# Patient Record
Sex: Female | Born: 1970 | Race: White | Hispanic: No | Marital: Married | Smoking: Light tobacco smoker
Health system: Southern US, Community
[De-identification: ages and names within clinical notes are randomized; demographics above are authoritative.]

## PROBLEM LIST (undated history)

## (undated) DIAGNOSIS — C539 Malignant neoplasm of cervix uteri, unspecified: Secondary | ICD-10-CM

## (undated) DIAGNOSIS — J45909 Unspecified asthma, uncomplicated: Secondary | ICD-10-CM

## (undated) DIAGNOSIS — E079 Disorder of thyroid, unspecified: Secondary | ICD-10-CM

## (undated) DIAGNOSIS — C439 Malignant melanoma of skin, unspecified: Secondary | ICD-10-CM

## (undated) HISTORY — PX: OTHER SURGICAL HISTORY: SHX169

---

## 2008-11-23 ENCOUNTER — Emergency Department (HOSPITAL_COMMUNITY): Admission: EM | Admit: 2008-11-23 | Discharge: 2008-11-23 | Payer: Self-pay | Admitting: Emergency Medicine

## 2010-12-01 LAB — GLUCOSE, CAPILLARY: Glucose-Capillary: 97 mg/dL (ref 70–99)

## 2011-08-30 ENCOUNTER — Emergency Department (HOSPITAL_COMMUNITY)
Admission: EM | Admit: 2011-08-30 | Discharge: 2011-08-30 | Disposition: A | Discharge status: Home / Self Care | Payer: Self-pay | Attending: Emergency Medicine | Admitting: Emergency Medicine

## 2011-08-30 DIAGNOSIS — R10819 Abdominal tenderness, unspecified site: Secondary | ICD-10-CM | POA: Insufficient documentation

## 2011-08-30 DIAGNOSIS — F17200 Nicotine dependence, unspecified, uncomplicated: Secondary | ICD-10-CM | POA: Insufficient documentation

## 2011-08-30 DIAGNOSIS — N3943 Post-void dribbling: Secondary | ICD-10-CM | POA: Insufficient documentation

## 2011-08-30 DIAGNOSIS — R300 Dysuria: Secondary | ICD-10-CM | POA: Insufficient documentation

## 2011-08-30 DIAGNOSIS — R3915 Urgency of urination: Secondary | ICD-10-CM | POA: Insufficient documentation

## 2011-08-30 DIAGNOSIS — N3090 Cystitis, unspecified without hematuria: Secondary | ICD-10-CM | POA: Insufficient documentation

## 2011-08-30 DIAGNOSIS — R350 Frequency of micturition: Secondary | ICD-10-CM | POA: Insufficient documentation

## 2011-08-30 LAB — URINALYSIS, ROUTINE W REFLEX MICROSCOPIC
Bilirubin Urine: NEGATIVE
Ketones, ur: NEGATIVE mg/dL
Leukocytes, UA: NEGATIVE
Nitrite: POSITIVE — AB
Protein, ur: NEGATIVE mg/dL
Urobilinogen, UA: 1 mg/dL (ref 0.0–1.0)
pH: 6.5 (ref 5.0–8.0)

## 2011-08-30 MED ORDER — PHENAZOPYRIDINE HCL 200 MG PO TABS: 200.0000 mg | ORAL_TABLET | Freq: Three times a day (TID) | ORAL | Status: AC

## 2011-08-30 MED ORDER — SULFAMETHOXAZOLE-TRIMETHOPRIM 800-160 MG PO TABS: 1.0000 | ORAL_TABLET | Freq: Two times a day (BID) | ORAL | Status: AC

## 2011-08-30 MED ORDER — SULFAMETHOXAZOLE-TMP DS 800-160 MG PO TABS
1.0000 | ORAL_TABLET | Freq: Once | ORAL | Status: AC
  Administered 2011-08-30: 1 via ORAL
  Filled 2011-08-30: qty 1

## 2011-08-30 NOTE — ED Provider Notes (Signed)
History     CSN: [id number]  Arrival date & time 08/30/11  0219   First MD Initiated Contact with Patient 08/30/11 [phone]      Chief Complaint  Patient presents with  . Urinary Frequency    (Consider location/radiation/quality/duration/timing/severity/associated sxs/prior treatment) Patient is a 41 y.o. female presenting with frequency.  Urinary Frequency   Patient relates she started having suprapubic pressure 6 days ago with frequency, urgency, dribbling, and dysuria. She relates she had some erythromycin antibiotics that she took for 5 days without improvement. She's been taking some pretty and that she had for UTI in September that is not helping control her discomfort. She denies nausea, vomiting, fever, or pain in her flank.  PCP Dr. Margo Common in [place]  History reviewed. No pertinent past medical history.  History reviewed. No pertinent past surgical history.  No family history on file.  History  Substance Use Topics  . Smoking status: Current Everyday Smoker one pack per week   . Smokeless tobacco: Not on file  . Alcohol Use: No   employed  OB History    Grav Para Term Preterm Abortions TAB SAB Ect Mult Living                  Review of Systems  Genitourinary: Positive for frequency.  All other systems reviewed and are negative.    Allergies  Penicillins  Home Medications  none   BP 132/90  Pulse 75  Temp(Src) 98 F (36.7 C) (Oral)  Resp 18  Ht 5\' 6"  (1.676 m)  Wt 130 lb (58.968 kg)  BMI 20.98 kg/m2  SpO2 100%  LMP 08/16/2011  Vital signs normal    Physical Exam  Nursing note and vitals reviewed. Constitutional: She is oriented to person, place, and time. She appears well-developed and well-nourished.  Non-toxic appearance. She does not appear ill. No distress.  HENT:  Head: Normocephalic and atraumatic.  Right Ear: External ear normal.  Left Ear: External ear normal.  Nose: Nose normal. No mucosal edema or rhinorrhea.  Mouth/Throat:  Oropharynx is clear and moist and mucous membranes are normal. No dental abscesses or uvula swelling.  Eyes: Conjunctivae and EOM are normal. Pupils are equal, round, and reactive to light.  Neck: Normal range of motion and full passive range of motion without pain. Neck supple.  Cardiovascular: Normal rate, regular rhythm and normal heart sounds.  Exam reveals no gallop and no friction rub.   No murmur heard. Pulmonary/Chest: Effort normal and breath sounds normal. No respiratory distress. She has no wheezes. She has no rhonchi. She has no rales. She exhibits no tenderness and no crepitus.  Abdominal: Soft. Normal appearance and bowel sounds are normal. She exhibits no distension. There is tenderness. There is no rebound and no guarding.       Patient has mild suprapubic tenderness, she does not have flank pain to percussion  Musculoskeletal: Normal range of motion. She exhibits no edema and no tenderness.       Moves all extremities well.   Neurological: She is alert and oriented to person, place, and time. She has normal strength. No cranial nerve deficit.  Skin: Skin is warm, dry and intact. No rash noted. No erythema. No pallor.  Psychiatric: She has a normal mood and affect. Her speech is normal and behavior is normal. Her mood appears not anxious.    ED Course  Procedures (including critical care time)  Asian started on Septra DS for her UTI.  Results for orders  placed during the hospital encounter of 08/30/11  URINALYSIS, ROUTINE W REFLEX MICROSCOPIC      Component Value Range   Color, Urine AMBER (*) YELLOW    APPearance CLEAR  CLEAR    Specific Gravity, Urine 1.025  1.005 - 1.030    pH 6.5  5.0 - 8.0    Glucose, UA NEGATIVE  NEGATIVE (mg/dL)   Hgb urine dipstick LARGE (*) NEGATIVE    Bilirubin Urine NEGATIVE  NEGATIVE    Ketones, ur NEGATIVE  NEGATIVE (mg/dL)   Protein, ur NEGATIVE  NEGATIVE (mg/dL)   Urobilinogen, UA 1.0  0.0 - 1.0 (mg/dL)   Nitrite POSITIVE (*) NEGATIVE      Leukocytes, UA NEGATIVE  NEGATIVE   PREGNANCY, URINE      Component Value Range   Preg Test, Ur NEGATIVE    URINE MICROSCOPIC-ADD ON      Component Value Range   Squamous Epithelial / LPF RARE  RARE    WBC, UA 0-2  <3 (WBC/hpf)   RBC / HPF 11-20  <3 (RBC/hpf)   Bacteria, UA FEW (*) RARE    Laboratory interpretation all normal except for UTI     1. Cystitis    New Prescriptions   PHENAZOPYRIDINE (PYRIDIUM) 200 MG TABLET    Take 1 tablet (200 mg total) by mouth 3 (three) times daily.   SULFAMETHOXAZOLE-TRIMETHOPRIM (SEPTRA DS) 800-160 MG PER TABLET    Take 1 tablet by mouth 2 (two) times daily.    Plan discharge  MDM          Ward Givens, MD 08/30/11 [phone]

## 2011-08-30 NOTE — ED Notes (Signed)
Thinks she has a uti, has been taking pyridium and azo, w. No relief, has also been taking antibiotic ince alst week not getting any better

## 2016-09-16 ENCOUNTER — Emergency Department (HOSPITAL_COMMUNITY)
Admission: EM | Admit: 2016-09-16 | Discharge: 2016-09-16 | Disposition: A | Discharge status: Home / Self Care | Payer: Self-pay | Attending: Emergency Medicine | Admitting: Emergency Medicine

## 2016-09-16 ENCOUNTER — Emergency Department (HOSPITAL_COMMUNITY): Payer: Self-pay

## 2016-09-16 ENCOUNTER — Encounter (HOSPITAL_COMMUNITY): Payer: Self-pay | Admitting: Emergency Medicine

## 2016-09-16 DIAGNOSIS — J189 Pneumonia, unspecified organism: Secondary | ICD-10-CM

## 2016-09-16 DIAGNOSIS — Z791 Long term (current) use of non-steroidal anti-inflammatories (NSAID): Secondary | ICD-10-CM | POA: Insufficient documentation

## 2016-09-16 DIAGNOSIS — F17200 Nicotine dependence, unspecified, uncomplicated: Secondary | ICD-10-CM | POA: Insufficient documentation

## 2016-09-16 DIAGNOSIS — J181 Lobar pneumonia, unspecified organism: Secondary | ICD-10-CM | POA: Insufficient documentation

## 2016-09-16 MED ORDER — DOXYCYCLINE HYCLATE 100 MG PO TABS
100.0000 mg | ORAL_TABLET | Freq: Once | ORAL | Status: AC
  Administered 2016-09-16: 100 mg via ORAL
  Filled 2016-09-16: qty 1

## 2016-09-16 MED ORDER — BENZONATATE 100 MG PO CAPS: 100.0000 mg | ORAL_CAPSULE | Freq: Three times a day (TID) | ORAL | 0 refills | Status: DC

## 2016-09-16 MED ORDER — DOXYCYCLINE HYCLATE 100 MG PO CAPS: 100.0000 mg | ORAL_CAPSULE | Freq: Two times a day (BID) | ORAL | 0 refills | Status: AC

## 2016-09-16 NOTE — ED Provider Notes (Signed)
[facility]-EMERGENCY DEPT Provider Note   CSN: [id number] Arrival date & time: 09/16/16  2114     History   Chief Complaint Chief Complaint  Patient presents with  . Fever    HPI Hannah PillowWendy J Sahota is a 46 y.o. female.  HPI   Hannah Ward is a 46 y.o. female, with a history of recurrent sinus infections, presenting to the ED with sinus congestion, drainage, and pressure for the last week. Began to get better and then developed a fever and productive cough with green sputum this morning. Has taken tessalon with some cough relief. She also took some ibuprofen for her fever at around 8 PM tonight. Patient is also concerned she may have developed pneumonia.  Denies N/V/D, difficulty breathing, chest pain, or any other complaints.  No ABX use in the last 3 months.     History reviewed. No pertinent past medical history.  There are no active problems to display for this patient.   History reviewed. No pertinent surgical history.  OB History    No data available       Home Medications    Prior to Admission medications   Medication Sig Start Date End Date Taking? Authorizing Provider  ibuprofen (ADVIL,MOTRIN) 200 MG tablet Take 600 mg by mouth daily as needed for mild pain or moderate pain.   Yes Historical Provider, MD  benzonatate (TESSALON) 100 MG capsule Take 1 capsule (100 mg total) by mouth every 8 (eight) hours. 09/16/16   Shawn C Joy, PA-C  doxycycline (VIBRAMYCIN) 100 MG capsule Take 1 capsule (100 mg total) by mouth 2 (two) times daily. 09/16/16 09/23/16  Anselm PancoastShawn C Joy, PA-C    Family History No family history on file.  Social History Social History  Substance Use Topics  . Smoking status: Light Tobacco Smoker  . Smokeless tobacco: Never Used  . Alcohol use No     Allergies   Penicillins   Review of Systems Review of Systems  Constitutional: Positive for chills and fever.  HENT: Positive for congestion, sinus pain and sinus pressure. Negative for facial  swelling, sore throat and trouble swallowing.   Respiratory: Positive for cough. Negative for shortness of breath.   Cardiovascular: Negative for chest pain.  Gastrointestinal: Negative for abdominal pain, diarrhea, nausea and vomiting.  All other systems reviewed and are negative.    Physical Exam Updated Vital Signs BP 146/80 (BP Location: Left Arm)   Pulse 115   Temp 99.5 F (37.5 C) (Oral)   Resp 18   Ht 5\' 6"  (1.676 m)   Wt 45.4 kg   LMP 09/15/2016   SpO2 97%   BMI 16.14 kg/m   Physical Exam  Constitutional: She appears well-developed and well-nourished. No distress.  HENT:  Head: Normocephalic and atraumatic.  Eyes: Conjunctivae are normal.  Neck: Neck supple.  Cardiovascular: Normal rate, regular rhythm, normal heart sounds and intact distal pulses.   Pulmonary/Chest: Effort normal. No respiratory distress. She has wheezes in the right lower field, the left middle field and the left lower field.  Abdominal: Soft. There is no tenderness. There is no guarding.  Musculoskeletal: She exhibits no edema.  Lymphadenopathy:    She has no cervical adenopathy.  Neurological: She is alert.  Skin: Skin is warm and dry. She is not diaphoretic.  Psychiatric: She has a normal mood and affect. Her behavior is normal.  Nursing note and vitals reviewed.    ED Treatments / Results  Labs (all labs ordered are listed, but  only abnormal results are displayed) Labs Reviewed - No data to display  EKG  EKG Interpretation None       Radiology Dg Chest 2 View  Result Date: 09/16/2016 CLINICAL DATA:  46 year old female with wheezing and productive cough and fever. EXAM: CHEST  2 VIEW COMPARISON:  None. FINDINGS: Two views of the chest demonstrate focal area of increased density in the left lower lobe posteriorly concerning for developing infiltrate. The right lung is clear. There is no pleural effusion or pneumothorax. The cardiac silhouette is within normal limits. No acute  osseous pathology. IMPRESSION: Left lower lobe pneumonia. Clinical correlation and follow-up resolution recommended. Electronically Signed   By: Elgie Collard M.D.   On: 09/16/2016 23:02    Procedures Procedures (including critical care time)  Medications Ordered in ED Medications  doxycycline (VIBRA-TABS) tablet 100 mg (100 mg Oral Given 09/16/16 2326)     Initial Impression / Assessment and Plan / ED Course  I have reviewed the triage vital signs and the nursing notes.  Pertinent labs & imaging results that were available during my care of the patient were reviewed by me and considered in my medical decision making (see chart for details).     Patient presents with cough and fever. Evidence of pneumonia on chest x-ray. Antibiotic therapy initiated. PCP follow-up. Further home care and return precautions discussed. Patient voices understanding of all instructions and is comfortable with discharge.   Vitals:   09/16/16 2126 09/16/16 2137 09/16/16 2327  BP: 146/80  119/74  Pulse: 115  101  Resp: 18  16  Temp: 99.5 F (37.5 C)  98.5 F (36.9 C)  TempSrc: Oral  Oral  SpO2: 97%  98%  Weight:  45.4 kg   Height:  5\' 6"  (1.676 m)      Final Clinical Impressions(s) / ED Diagnoses   Final diagnoses:  Community acquired pneumonia of left lower lobe of lung (HCC)    New Prescriptions Discharge Medication List as of 09/16/2016 11:16 PM    START taking these medications   Details  benzonatate (TESSALON) 100 MG capsule Take 1 capsule (100 mg total) by mouth every 8 (eight) hours., Starting Fri 09/16/2016, Print    doxycycline (VIBRAMYCIN) 100 MG capsule Take 1 capsule (100 mg total) by mouth 2 (two) times daily., Starting Fri 09/16/2016, Until Fri 09/23/2016, Print         Anselm Pancoast, PA-C 09/16/16 2340    Bethann Berkshire, MD 09/17/16 2300

## 2016-09-16 NOTE — Discharge Instructions (Signed)
There was evidence of pneumonia in the left lung. Please take all of your antibiotics until finished!   You may develop abdominal discomfort or diarrhea from the antibiotic.  You may help offset this with probiotics which you can buy or get in yogurt. Do not eat or take the probiotics until 2 hours after your antibiotic.  Tessalon for cough. Ibuprofen, naproxen, or Tylenol for fever or pain. Follow up with your primary care provider for continued management of this issue. Return to the ED should symptoms worsen.

## 2016-09-16 NOTE — ED Triage Notes (Signed)
Pt sick earlier this week with flu like s/s.  Felt better for past couple of days.  Fever of 100.4 at 8pm tonight, with cough and congestion.  Took 600mg  ibuprofen at 8pm tonight.

## 2019-03-14 ENCOUNTER — Other Ambulatory Visit: Payer: Self-pay

## 2019-03-14 ENCOUNTER — Encounter (HOSPITAL_COMMUNITY): Payer: Self-pay

## 2019-03-14 ENCOUNTER — Emergency Department (HOSPITAL_COMMUNITY): Payer: Self-pay

## 2019-03-14 ENCOUNTER — Emergency Department (HOSPITAL_COMMUNITY)
Admission: EM | Admit: 2019-03-14 | Discharge: 2019-03-14 | Disposition: A | Discharge status: Home / Self Care | Payer: Self-pay | Attending: Emergency Medicine | Admitting: Emergency Medicine

## 2019-03-14 DIAGNOSIS — F17200 Nicotine dependence, unspecified, uncomplicated: Secondary | ICD-10-CM | POA: Insufficient documentation

## 2019-03-14 DIAGNOSIS — M5441 Lumbago with sciatica, right side: Secondary | ICD-10-CM | POA: Insufficient documentation

## 2019-03-14 DIAGNOSIS — M5442 Lumbago with sciatica, left side: Secondary | ICD-10-CM | POA: Insufficient documentation

## 2019-03-14 DIAGNOSIS — R202 Paresthesia of skin: Secondary | ICD-10-CM | POA: Insufficient documentation

## 2019-03-14 LAB — URINALYSIS, ROUTINE W REFLEX MICROSCOPIC
Bacteria, UA: NONE SEEN
Bilirubin Urine: NEGATIVE
Glucose, UA: NEGATIVE mg/dL
Ketones, ur: NEGATIVE mg/dL
Leukocytes,Ua: NEGATIVE
Nitrite: NEGATIVE
Protein, ur: NEGATIVE mg/dL
Specific Gravity, Urine: 1.001 — ABNORMAL LOW (ref 1.005–1.030)
pH: 8 (ref 5.0–8.0)

## 2019-03-14 LAB — POC URINE PREG, ED: Preg Test, Ur: NEGATIVE

## 2019-03-14 MED ORDER — HYDROCODONE-ACETAMINOPHEN 5-325 MG PO TABS: ORAL_TABLET | ORAL | 0 refills | Status: DC

## 2019-03-14 MED ORDER — KETOROLAC TROMETHAMINE 60 MG/2ML IM SOLN
60.0000 mg | Freq: Once | INTRAMUSCULAR | Status: AC
  Administered 2019-03-14: 60 mg via INTRAMUSCULAR
  Filled 2019-03-14: qty 2

## 2019-03-14 MED ORDER — CYCLOBENZAPRINE HCL 10 MG PO TABS: 10.0000 mg | ORAL_TABLET | Freq: Three times a day (TID) | ORAL | 0 refills | Status: DC | PRN

## 2019-03-14 MED ORDER — DIAZEPAM 5 MG PO TABS
5.0000 mg | ORAL_TABLET | Freq: Once | ORAL | Status: AC
  Administered 2019-03-14: 12:00:00 5 mg via ORAL
  Filled 2019-03-14: qty 1

## 2019-03-14 MED ORDER — PREDNISONE 10 MG PO TABS: ORAL_TABLET | ORAL | 0 refills | Status: DC

## 2019-03-14 NOTE — ED Provider Notes (Signed)
[facility] EMERGENCY DEPARTMENT Provider Note   CSN: [id number] Arrival date & time: 03/14/19  1017     History   Chief Complaint Chief Complaint  Patient presents with  . Back Pain    HPI Hannah Ward is a 48 y.o. female.     HPI   Hannah Ward is a 48 y.o. female who presents to the Emergency Department complaining of low back pain for 6 weeks.  She reports noticing a sharp pain to her mid lower back several months ago while bending over.  Since that time, she reports intermittent episodes of pain to her lower back that radiates into both thighs.  She has been seeing a chiropractor and having adjustments to her spine 3 times a week.  Initially she states the manipulation seem to be improving her symptoms, but pain has been worse and more persistent since yesterday.  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  She describes the pain as sharp and worse with movement.  Pain improves with rest.  She describes a tingling pain to the front of her thighs.  She denies numbness of her lower extremities, abdominal pain, urine or bowel changes, fever or chills.  She took ibuprofen and one hydrocodone last evening without relief.   History reviewed. No pertinent past medical history.  There are no active problems to display for this patient.   History reviewed. No pertinent surgical history.   OB History   No obstetric history on file.      Home Medications    Prior to Admission medications   Medication Sig Start Date End Date Taking? Authorizing Provider  benzonatate (TESSALON) 100 MG capsule Take 1 capsule (100 mg total) by mouth every 8 (eight) hours. 09/16/16   Joy, Shawn C, PA-C  ibuprofen (ADVIL,MOTRIN) 200 MG tablet Take 600 mg by mouth daily as needed for mild pain or moderate pain.    [provider]    Family History No family history on file.  Social History Social History   Tobacco Use  . Smoking status: Light  Tobacco Smoker  . Smokeless tobacco: Never Used  Substance Use Topics  . Alcohol use: No  . Drug use: No     Allergies   Penicillins   Review of Systems Review of Systems  Constitutional: Negative for fever.  Respiratory: Negative for shortness of breath.   Cardiovascular: Negative for chest pain.  Gastrointestinal: Negative for abdominal pain, constipation, diarrhea, nausea and vomiting.  Genitourinary: Negative for decreased urine volume, difficulty urinating, dysuria, flank pain and hematuria.  Musculoskeletal: Positive for back pain. Negative for joint swelling and neck pain.  Skin: Negative for rash.  Neurological: Negative for dizziness, weakness and numbness.     Physical Exam Updated Vital Signs BP 124/74   Pulse 66   Temp 98.6 F (37 C) (Oral)   Resp 16   Ht 5\' 6"  (1.676 m)   Wt 59 kg   LMP 02/23/2019 Comment: preg test neg  SpO2 100%   BMI 20.98 kg/m   Physical Exam Vitals signs and nursing note reviewed.  Constitutional:      General: She is not in acute distress.    Appearance: She is well-developed. She is not ill-appearing.     Comments: Patient appears uncomfortable  HENT:     Head: Atraumatic.  Neck:     Musculoskeletal: Normal range of motion and neck supple. No muscular tenderness.  Cardiovascular:  Rate and Rhythm: Normal rate and regular rhythm.     Pulses: Normal pulses.     Comments: DP pulses are strong and palpable bilaterally Pulmonary:     Effort: Pulmonary effort is normal. No respiratory distress.     Breath sounds: Normal breath sounds.  Chest:     Chest wall: No tenderness.  Abdominal:     General: There is no distension.     Palpations: Abdomen is soft.     Tenderness: There is no abdominal tenderness. There is no right CVA tenderness or left CVA tenderness.  Musculoskeletal:        General: Tenderness present.     Lumbar back: She exhibits tenderness and pain. She exhibits normal range of motion, no swelling, no  deformity, no laceration and normal pulse.     Comments: ttp of the lower lumbar spine and bilateral paraspinal muscles.  Pain is reproduced with straight leg raise bilaterally.  Pt has 5/5 strength against resistance of bilateral lower extremities.     Skin:    General: Skin is warm.     Capillary Refill: Capillary refill takes less than 2 seconds.     Findings: No rash.  Neurological:     General: No focal deficit present.     Mental Status: She is alert.     Sensory: Sensation is intact. No sensory deficit.     Motor: Motor function is intact. No weakness or abnormal muscle tone.     Coordination: Coordination is intact.     Gait: Gait normal.     Deep Tendon Reflexes:     Reflex Scores:      Patellar reflexes are 2+ on the right side and 2+ on the left side.      Achilles reflexes are 2+ on the right side and 2+ on the left side.    Comments: CN II-XII grossly intact.        ED Treatments / Results  Labs (all labs ordered are listed, but only abnormal results are displayed) Labs Reviewed  URINALYSIS, ROUTINE W REFLEX MICROSCOPIC - Abnormal; Notable for the following components:      Result Value   Color, Urine COLORLESS (*)    Specific Gravity, Urine 1.001 (*)    Hgb urine dipstick MODERATE (*)    All other components within normal limits  POC URINE PREG, ED    EKG None  Radiology Dg Lumbar Spine Complete  Result Date: 03/14/2019 CLINICAL DATA:  Chronic progressive low back pain. Bilateral leg pain. EXAM: LUMBAR SPINE - COMPLETE 4+ VIEW COMPARISON:  None. FINDINGS: There is no evidence of lumbar spine fracture. Alignment is normal. Intervertebral disc spaces are maintained. IMPRESSION: Negative. Electronically Signed   By: Francene BoyersJames  Maxwell M.D.   On: 03/14/2019 12:34    Procedures Procedures (including critical care time)  Medications Ordered in ED Medications  ketorolac (TORADOL) injection 60 mg (60 mg Intramuscular Given 03/14/19 1148)  diazepam (VALIUM) tablet 5  mg (5 mg Oral Given 03/14/19 1148)     Initial Impression / Assessment and Plan / ED Course  I have reviewed the triage vital signs and the nursing notes.  Pertinent labs & imaging results that were available during my care of the patient were reviewed by me and considered in my medical decision making (see chart for details).        Patient with low back pain for several weeks.  Neurovascularly intact.  She is ambulatory with a slow but steady gait.  No  concerning symptoms for emergent neurological process.  I discussed possibility of a disc related injury and she agrees to close PCP follow-up.  Referral information also provided.  She agrees to treatment plan and close follow-up.  Return precautions discussed.  Final Clinical Impressions(s) / ED Diagnoses   Final diagnoses:  Acute midline low back pain with bilateral sciatica    ED Discharge Orders    None       Pauline Ausriplett, Reka Wist, PA-C 03/14/19 1313    Bethann BerkshireZammit, Joseph, MD 03/14/19 1506

## 2019-03-14 NOTE — ED Triage Notes (Signed)
Pt reports she has had lower back pain for 6 weeks and has been seeing a Restaurant manager, fast food. Reports yesterday she worked and pain got worse. Works as Emergency planning/management officer

## 2019-03-14 NOTE — ED Notes (Signed)
Patient transported to X-ray 

## 2019-03-14 NOTE — ED Notes (Signed)
Pregnancy test negative

## 2019-03-14 NOTE — Discharge Instructions (Addendum)
Alternate ice and heat to your lower back.  Take the medication as directed.  I have provided a list of primary providers.  Please contact someone to establish primary care.  I have also listed spine and neurosurgery group that she may contact in 1 week if not improving.  Return to the ER for any worsening symptoms such as fever, chills, worsening pain or numbness of your lower extremities.   [facility] Primary Care Doctor List    Sinda Du MD. Specialty: Pulmonary Disease Contact information: [place]   [place] [address]  [phone]   Tula Nakayama, MD. Specialty: [facility] Medicine Contact information: [address] [place] [address]  [phone]   Sallee Lange, MD. Specialty: [facility] Medicine Contact information: [place]  [place] [address]  [phone]   Rosita Fire, MD Specialty: Internal Medicine Contact information: [place] [address]  [phone]   Delphina Cahill, MD. Specialty: Internal Medicine Contact information: [place] [address]  [phone]    [facility] (Dr. Maudie Mercury) Specialty: Family Medicine Contact information: [place] [address]  [phone]   Leslie Andrea, MD. Specialty: [facility] Medicine Contact information: [place] [address]  [phone]   Asencion Noble, MD. Specialty: Internal Medicine Contact information: [address]  [place] [address]  [place]  [address] [place], [place] [address] [phone]  Services [facility] offers a variety of basic health services.  Services include but are not limited to: Blood pressure checks  Heart rate checks  Blood sugar checks  Urine analysis  Rapid strep tests  Pregnancy tests.  Health education and  referrals  People needing more complex services will be directed to a physician online. Using these virtual visits, doctors can evaluate and prescribe medicine and treatments. There will be no medication on-site, though [facility] will help patients fill their prescriptions at little to no cost.   For More information please go to: [email or web address]

## 2019-05-22 ENCOUNTER — Other Ambulatory Visit (HOSPITAL_COMMUNITY): Payer: Self-pay | Admitting: Neurosurgery

## 2019-05-22 ENCOUNTER — Other Ambulatory Visit: Payer: Self-pay | Admitting: Neurosurgery

## 2019-05-22 DIAGNOSIS — M545 Low back pain, unspecified: Secondary | ICD-10-CM

## 2019-05-27 ENCOUNTER — Ambulatory Visit (HOSPITAL_COMMUNITY)
Admission: RE | Admit: 2019-05-27 | Discharge: 2019-05-27 | Disposition: A | Discharge status: Home / Self Care | Payer: Self-pay | Source: Ambulatory Visit | Attending: Neurosurgery | Admitting: Neurosurgery

## 2019-05-27 ENCOUNTER — Other Ambulatory Visit: Payer: Self-pay

## 2019-05-27 DIAGNOSIS — M545 Low back pain, unspecified: Secondary | ICD-10-CM

## 2020-03-07 ENCOUNTER — Ambulatory Visit
Admission: EM | Admit: 2020-03-07 | Discharge: 2020-03-07 | Disposition: A | Discharge status: Home / Self Care | Payer: Self-pay | Attending: Emergency Medicine | Admitting: Emergency Medicine

## 2020-03-07 ENCOUNTER — Other Ambulatory Visit: Payer: Self-pay

## 2020-03-07 ENCOUNTER — Encounter: Payer: Self-pay | Admitting: Emergency Medicine

## 2020-03-07 DIAGNOSIS — A6920 Lyme disease, unspecified: Secondary | ICD-10-CM

## 2020-03-07 DIAGNOSIS — W57XXXA Bitten or stung by nonvenomous insect and other nonvenomous arthropods, initial encounter: Secondary | ICD-10-CM

## 2020-03-07 MED ORDER — DOXYCYCLINE HYCLATE 100 MG PO CAPS: 100.0000 mg | ORAL_CAPSULE | Freq: Two times a day (BID) | ORAL | 0 refills | Status: AC

## 2020-03-07 NOTE — Discharge Instructions (Addendum)
Prescribed doxycycline for 14 days Continue to use hydrocortisone cream as needed for itching To prevent tick bites, wear long sleeves, long pants, and light colors. Use insect repellent. Follow the instructions on the bottle. If the tick is biting, do not try to remove it with heat, alcohol, petroleum jelly, or fingernail polish. Use tweezers, curved forceps, or a tick-removal tool to grasp the tick. Gently pull up until the tick lets go. Do not twist or jerk the tick. Do not squeeze or crush the tick. Return here or go to ER if you have any new or worsening symptoms (rash, nausea, vomiting, fever, chills, headache, fatigue)

## 2020-03-07 NOTE — ED Triage Notes (Signed)
Pt has area to RT ankle that is reddened and circular.  Pt states she pulled a tick off 02/24/20. Legs have been aching this past week.

## 2020-03-07 NOTE — ED Provider Notes (Signed)
[facility]   [id number] 03/07/20 Arrival Time: 1220   Chief Complaint  Patient presents with  . Insect Bite     SUBJECTIVE: History from: patient.  Hannah Ward is a 49 y.o. female presented to the urgent care with a complaint of tick bite to right ankle for the past 12 days.  Now patient is complaining of rash with redness and central clearing.  Denies a precipitating event, noticed while driving.  Localizes the bite to her right ankle.  Has removed to take.  Denies previous hx of tick bite.  Denies fever, chills, nausea, vomiting, headache, dizziness, weakness, fatigue, rash, or abdominal pain.    ROS: As per HPI.  All other pertinent ROS negative.      History reviewed. No pertinent past medical history. History reviewed. No pertinent surgical history. Allergies  Allergen Reactions  . Penicillins Shortness Of Breath and Swelling    Has patient had a PCN reaction causing immediate rash, facial/tongue/throat swelling, SOB or lightheadedness with hypotension: Yes Has patient had a PCN reaction causing severe rash involving mucus membranes or skin necrosis: No Has patient had a PCN reaction that required hospitalization No Has patient had a PCN reaction occurring within the last 10 years: Non  If all of the above answers are "NO", then may proceed with Cephalosporin use.    No current facility-administered medications on file prior to encounter.   Current Outpatient Medications on File Prior to Encounter  Medication Sig Dispense Refill  . benzonatate (TESSALON) 100 MG capsule Take 1 capsule (100 mg total) by mouth every 8 (eight) hours. 21 capsule 0  . cyclobenzaprine (FLEXERIL) 10 MG tablet Take 1 tablet (10 mg total) by mouth 3 (three) times daily as needed. 21 tablet 0  . HYDROcodone-acetaminophen (NORCO/VICODIN) 5-325 MG tablet Take one tab po q 4 hrs prn pain 10 tablet 0  . ibuprofen (ADVIL,MOTRIN) 200 MG tablet Take 600 mg by mouth daily as needed for mild  pain or moderate pain.    . predniSONE (DELTASONE) 10 MG tablet Take 6 tablets day one, 5 tablets day two, 4 tablets day three, 3 tablets day four, 2 tablets day five, then 1 tablet day six 21 tablet 0   Social History   Socioeconomic History  . Marital status: Married    Spouse name: Not on file  . Number of children: Not on file  . Years of education: Not on file  . Highest education level: Not on file  Occupational History  . Not on file  Tobacco Use  . Smoking status: Light Tobacco Smoker  . Smokeless tobacco: Never Used  Substance and Sexual Activity  . Alcohol use: No  . Drug use: No  . Sexual activity: Yes    Birth control/protection: I.U.D.  Other Topics Concern  . Not on file  Social History Narrative  . Not on file   Social Determinants of Health   Financial Resource Strain:   . Difficulty of Paying Living Expenses:   Food Insecurity:   . Worried About Programme researcher, broadcasting/film/video in the Last Year:   . Barista in the Last Year:   Transportation Needs:   . Freight forwarder (Medical):   Marland Kitchen Lack of Transportation (Non-Medical):   Physical Activity:   . Days of Exercise per Week:   . Minutes of Exercise per Session:   Stress:   . Feeling of Stress :   Social Connections:   . Frequency of Communication with  Friends and Family:   . Frequency of Social Gatherings with Friends and Family:   . Attends Religious Services:   . Active Member of Clubs or Organizations:   . Attends Banker Meetings:   Marland Kitchen Marital Status:   Intimate Partner Violence:   . Fear of Current or Ex-Partner:   . Emotionally Abused:   Marland Kitchen Physically Abused:   . Sexually Abused:    No family history on file.  OBJECTIVE:  Vitals:   03/07/20 1230 03/07/20 1232  BP:  (!) 148/89  Pulse:  92  Resp:  17  Temp:  98.5 F (36.9 C)  TempSrc:  Oral  SpO2:  98%  Weight: 137 lb (62.1 kg)   Height: 5\' 6"  (1.676 m)      Physical Exam Vitals and nursing note reviewed.    Constitutional:      General: She is not in acute distress.    Appearance: Normal appearance. She is normal weight. She is not ill-appearing, toxic-appearing or diaphoretic.  HENT:     Head: Normocephalic.  Cardiovascular:     Rate and Rhythm: Normal rate and regular rhythm.     Pulses: Normal pulses.     Heart sounds: Normal heart sounds. No murmur heard.  No friction rub. No gallop.   Pulmonary:     Effort: Pulmonary effort is normal. No respiratory distress.     Breath sounds: Normal breath sounds. No stridor. No wheezing, rhonchi or rales.  Chest:     Chest wall: No tenderness.  Skin:    General: Skin is warm.     Findings: Rash present.     Comments: Red circular rash with central clearing located on right ankle  Neurological:     Mental Status: She is alert and oriented to person, place, and time.    LABS:  No results found for this or any previous visit (from the past 24 hour(s)).   ASSESSMENT & PLAN:  1. Erythema migrans (Lyme disease)   2. Tick bite, initial encounter     Meds ordered this encounter  Medications  . doxycycline (VIBRAMYCIN) 100 MG capsule    Sig: Take 1 capsule (100 mg total) by mouth 2 (two) times daily for 14 days.    Dispense:  28 capsule    Refill:  0   Patient is stable at discharge.  Her symptom is likely from erythema migrans.  We will treat with doxycycline for 14 days  Discharge Instructions Prescribed doxycycline for 14 days Continue to use hydrocortisone cream as needed for itching To prevent tick bites, wear long sleeves, long pants, and light colors. Use insect repellent. Follow the instructions on the bottle. If the tick is biting, do not try to remove it with heat, alcohol, petroleum jelly, or fingernail polish. Use tweezers, curved forceps, or a tick-removal tool to grasp the tick. Gently pull up until the tick lets go. Do not twist or jerk the tick. Do not squeeze or crush the tick. Return here or go to ER if you have any new  or worsening symptoms (rash, nausea, vomiting, fever, chills, headache, fatigue)  Reviewed expectations re: course of current medical issues. Questions answered. Outlined signs and symptoms indicating need for more acute intervention. Patient verbalized understanding. After Visit Summary given.     Note: This document was prepared using Dragon voice recognition software and may include unintentional dictation errors.     , FNP 03/07/20 1302

## 2020-07-22 IMAGING — DX LUMBAR SPINE - COMPLETE 4+ VIEW
5 series · 5 of 5 positions shown · non-contrast
Comparison: None.

CLINICAL DATA: Chronic progressive low back pain. Bilateral leg
pain.

EXAM:
LUMBAR SPINE - COMPLETE 4+ VIEW

[l-spine ap]
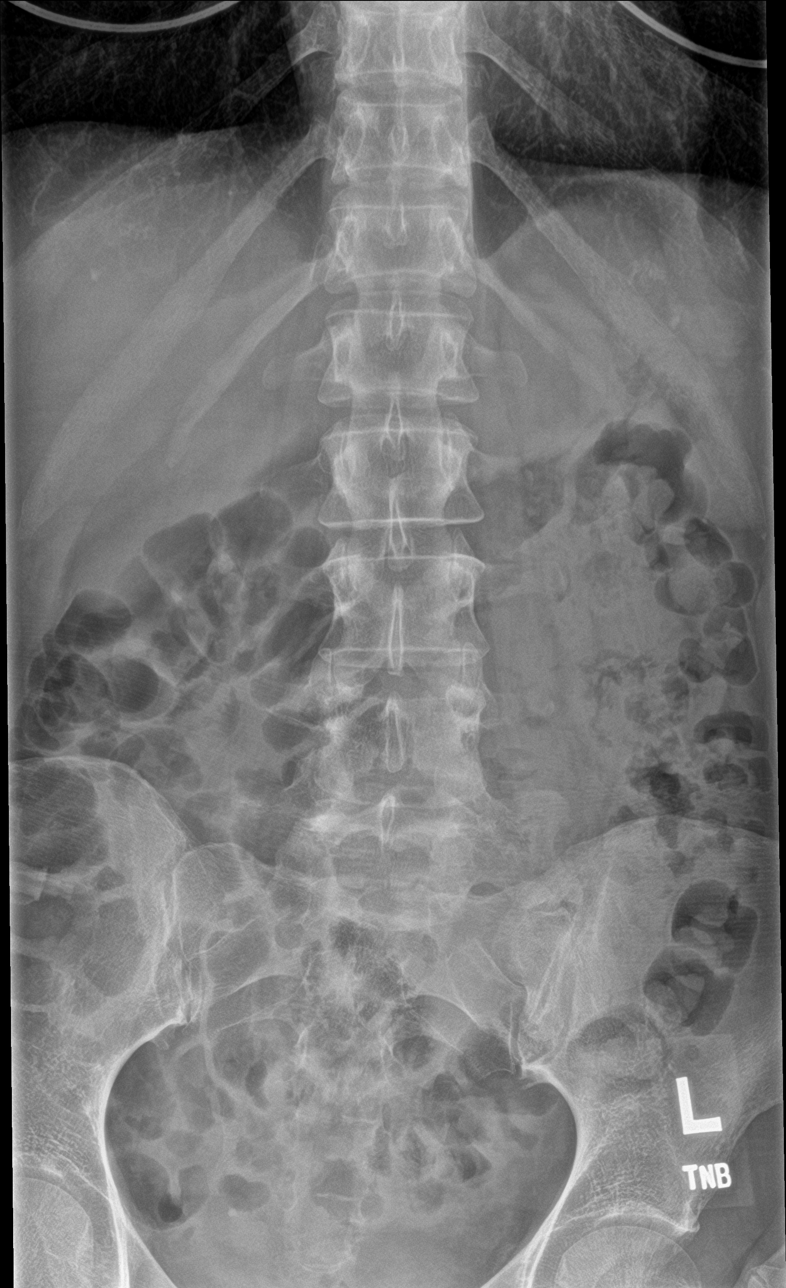

[l-spine obl (1 of 2)]
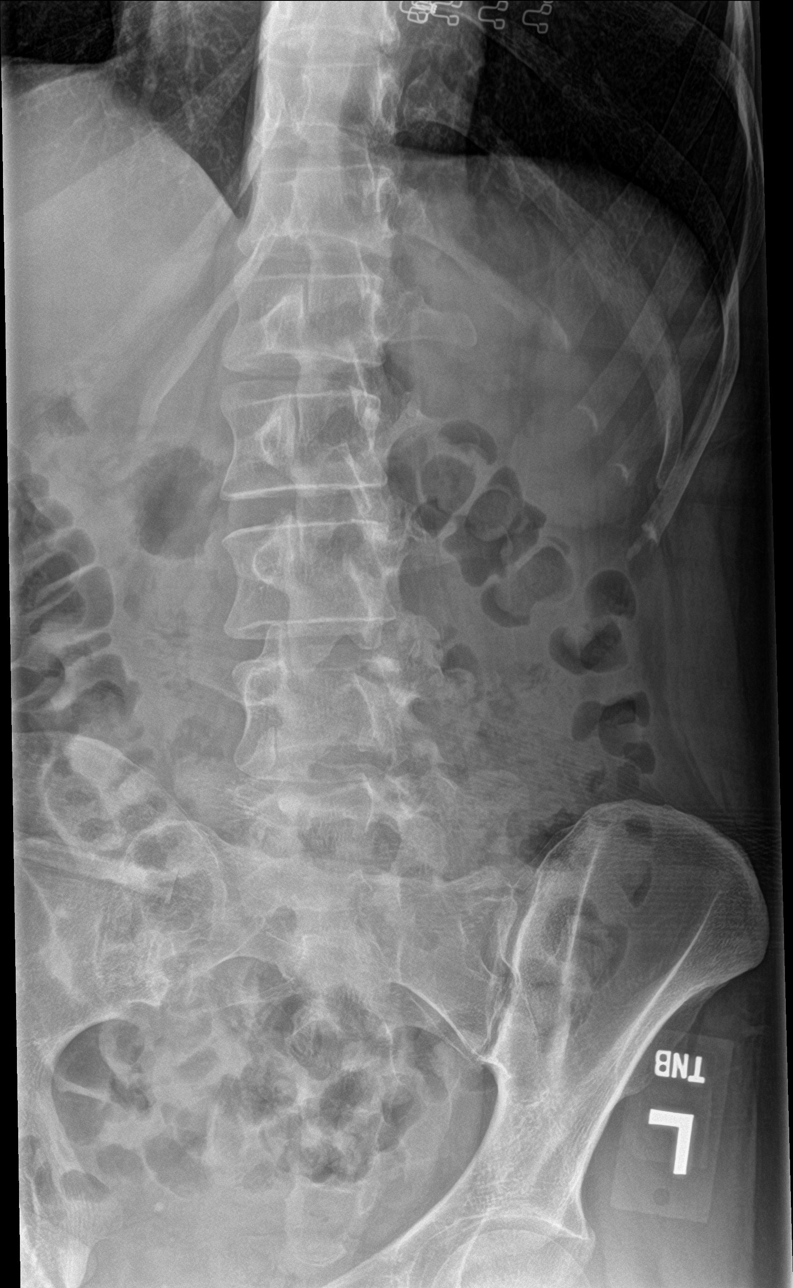

[l-spine obl (2 of 2)]
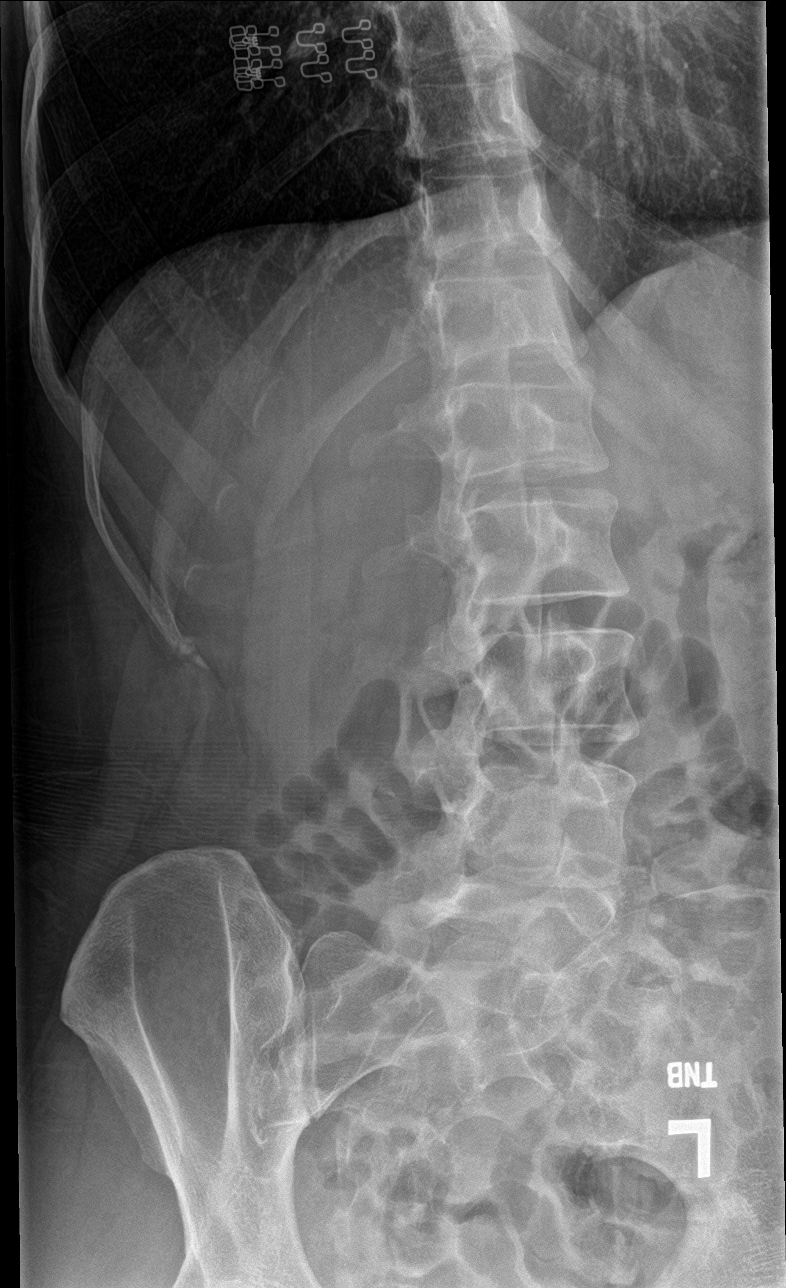

[l-spine lat]
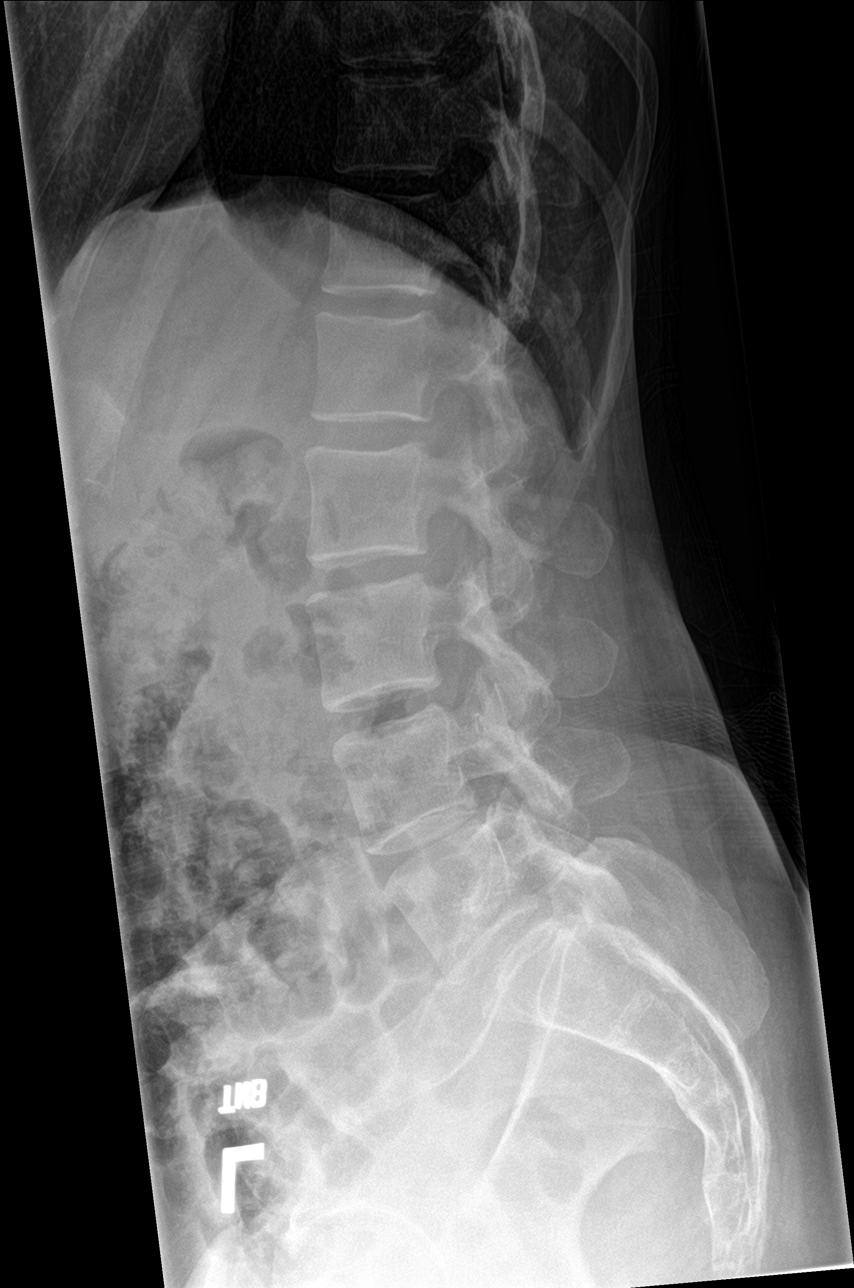

[l-spine spot]
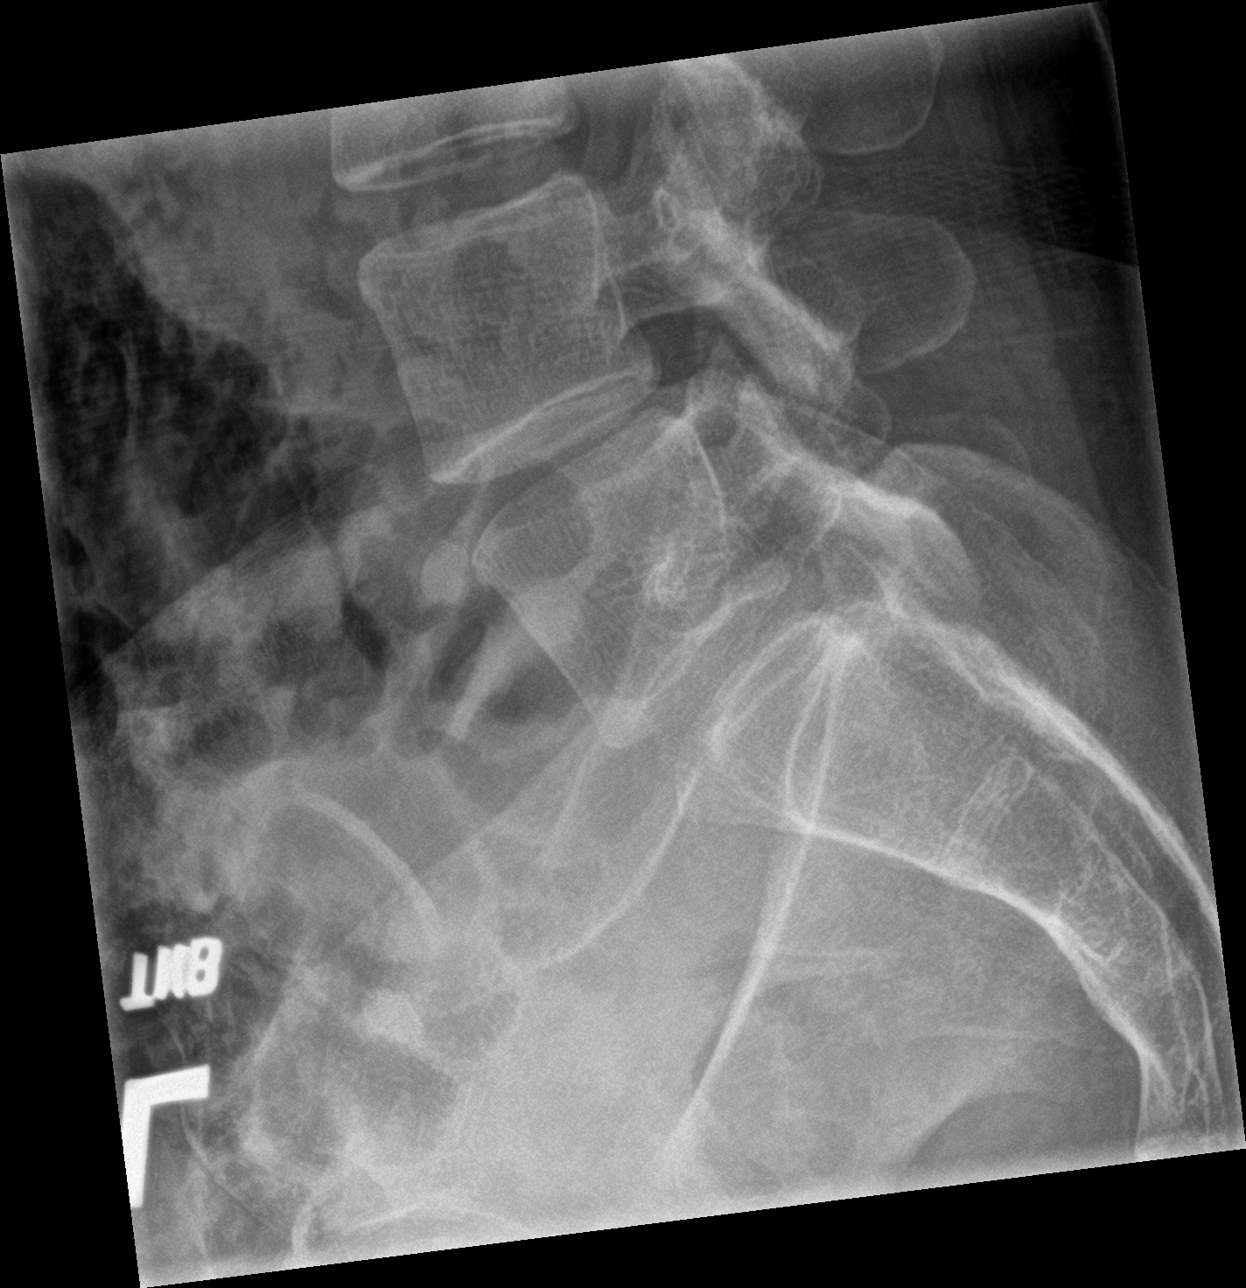

[5 of 5 positions shown; findings below may reference images not displayed]

FINDINGS: There is no evidence of lumbar spine fracture. Alignment is normal.
Intervertebral disc spaces are maintained.
IMPRESSION: Negative.

## 2021-10-23 ENCOUNTER — Ambulatory Visit
Admission: EM | Admit: 2021-10-23 | Discharge: 2021-10-23 | Disposition: A | Discharge status: Home / Self Care | Payer: Self-pay

## 2021-10-23 ENCOUNTER — Other Ambulatory Visit: Payer: Self-pay

## 2021-10-23 DIAGNOSIS — J22 Unspecified acute lower respiratory infection: Secondary | ICD-10-CM

## 2021-10-23 MED ORDER — GUAIFENESIN-CODEINE 100-10 MG/5ML PO SYRP: 5.0000 mL | ORAL_SOLUTION | Freq: Three times a day (TID) | ORAL | 0 refills | Status: AC | PRN

## 2021-10-23 MED ORDER — AZITHROMYCIN 250 MG PO TABS: 250.0000 mg | ORAL_TABLET | Freq: Every day | ORAL | 0 refills | Status: DC

## 2021-10-23 MED ORDER — ALBUTEROL SULFATE HFA 108 (90 BASE) MCG/ACT IN AERS: 1.0000 | INHALATION_SPRAY | Freq: Four times a day (QID) | RESPIRATORY_TRACT | 0 refills | Status: DC | PRN

## 2021-10-23 NOTE — ED Provider Notes (Signed)
?[facility] ? ? ? ?CSN: [id number] ?Arrival date & time: 10/23/21  0854 ? ? ?  ? ?History   ?Chief Complaint ?Chief Complaint  ?Patient presents with  ? Cough  ? Nasal Congestion  ? Sore Throat  ?   ?  ? ? ?HPI ?Hannah Ward is a 51 y.o. female.  ? ?51 year old female presents today with approximately 1 week of worsening cough, congestion, mild shortness of breath.  History of pneumonia and feels similar.  Denies any nasal congestion, rhinorrhea, ear pain, sore throat.  Taking Benadryl ? ? ?Cough ?Sore Throat ? ? ?History reviewed. No pertinent past medical history. ? ?There are no problems to display for this patient. ? ? ?History reviewed. No pertinent surgical history. ? ?OB History   ?No obstetric history on file. ?  ? ? ? ?Home Medications   ? ?Prior to Admission medications   ?Medication Sig Start Date End Date Taking? Authorizing Provider  ?albuterol (VENTOLIN HFA) 108 (90 Base) MCG/ACT inhaler Inhale 1-2 puffs into the lungs every 6 (six) hours as needed for wheezing or shortness of breath. 10/23/21  Yes Nazariah Cadet A, NP  ?azithromycin (ZITHROMAX) 250 MG tablet Take 1 tablet (250 mg total) by mouth daily. Take first 2 tablets together, then 1 every day until finished. 10/23/21  Yes Janace Aris, NP  ?Biotin 1000 MCG tablet Take 1,000 mcg by mouth 3 (three) times daily.   Yes [provider]  ?diphenhydrAMINE (BENADRYL) 50 MG tablet Take 50 mg by mouth at bedtime as needed for itching.   Yes [provider]  ?guaiFENesin-codeine (ROBITUSSIN AC) 100-10 MG/5ML syrup Take 5 mLs by mouth 3 (three) times daily as needed for up to 7 days for cough. 10/23/21 10/30/21 Yes Markesha Hannig A, NP  ?HYDROcodone-acetaminophen (NORCO/VICODIN) 5-325 MG tablet Take one tab po q 4 hrs prn pain 03/14/19   Triplett, Tammy, PA-C  ?ibuprofen (ADVIL,MOTRIN) 200 MG tablet Take 600 mg by mouth daily as needed for mild pain or moderate pain.    [provider]  ?predniSONE (DELTASONE) 10 MG tablet Take 6  tablets day one, 5 tablets day two, 4 tablets day three, 3 tablets day four, 2 tablets day five, then 1 tablet day six 03/14/19   Triplett, Tammy, PA-C  ? ? ?Family History ?History reviewed. No pertinent family history. ? ?Social History ?Social History  ? ?Tobacco Use  ? Smoking status: Light Smoker  ? Smokeless tobacco: Never  ?Substance Use Topics  ? Alcohol use: No  ? Drug use: No  ? ? ? ?Allergies   ?Penicillins ? ? ?Review of Systems ?Review of Systems  ?Respiratory:  Positive for cough.   ? ? ?Physical Exam ?Triage Vital Signs ?ED Triage Vitals  ?Enc Vitals Group  ?   BP 10/23/21 0902 (!) 95/55  ?   Pulse Rate 10/23/21 0902 (!) 102  ?   Resp 10/23/21 0902 18  ?   Temp 10/23/21 0902 98.8 ?F (37.1 ?C)  ?   Temp Source 10/23/21 0902 Oral  ?   SpO2 10/23/21 0902 95 %  ?   Weight --   ?   Height --   ?   Head Circumference --   ?   Peak Flow --   ?   Pain Score 10/23/21 0904 4  ?   Pain Loc --   ?   Pain Edu? --   ?   Excl. in GC? --   ? ?No data found. ? ?  Updated Vital Signs ?BP (!) 95/55 (BP Location: Right Arm)   Pulse (!) 102   Temp 98.8 ?F (37.1 ?C) (Oral)   Resp 18   SpO2 95%  ? ?Visual Acuity ?Right Eye Distance:   ?Left Eye Distance:   ?Bilateral Distance:   ? ?Right Eye Near:   ?Left Eye Near:    ?Bilateral Near:    ? ?Physical Exam ?Constitutional:   ?   Appearance: She is well-developed. She is ill-appearing.  ?HENT:  ?   Head: Normocephalic and atraumatic.  ?   Right Ear: Tympanic membrane and ear canal normal.  ?   Left Ear: Tympanic membrane and ear canal normal.  ?   Mouth/Throat:  ?   Pharynx: Oropharynx is clear.  ?Cardiovascular:  ?   Rate and Rhythm: Tachycardia present.  ?Pulmonary:  ?   Effort: Pulmonary effort is normal.  ?   Breath sounds: Wheezing and rhonchi present.  ?Musculoskeletal:  ?   Cervical back: Normal range of motion.  ?Skin: ?   General: Skin is warm and dry.  ?Neurological:  ?   Mental Status: She is alert.  ? ? ? ?UC Treatments / Results  ?Labs ?(all labs ordered are  listed, but only abnormal results are displayed) ?Labs Reviewed - No data to display ? ?EKG ? ? ?Radiology ?No results found. ? ?Procedures ?Procedures (including critical care time) ? ?Medications Ordered in UC ?Medications - No data to display ? ?Initial Impression / Assessment and Plan / UC Course  ?I have reviewed the triage vital signs and the nursing notes. ? ?Pertinent labs & imaging results that were available during my care of the patient were reviewed by me and considered in my medical decision making (see chart for details). ? ?  ? ?Concern for bacterial bronchitis versus pneumonia.  ?Treated with azithromycin. ?Cough medication as needed.  Albuterol as needed ?Final Clinical Impressions(s) / UC Diagnoses  ? ?Final diagnoses:  ?Lower respiratory infection (e.g., bronchitis, pneumonia, pneumonitis, pulmonitis)  ? ? ? ?Discharge Instructions   ? ?  ?Treating you for upper respiratory infection ?Take the medications as prescribed.  Follow-up as needed. ? ? ? ?ED Prescriptions   ? ? Medication Sig Dispense Auth. Provider  ? guaiFENesin-codeine (ROBITUSSIN AC) 100-10 MG/5ML syrup Take 5 mLs by mouth 3 (three) times daily as needed for up to 7 days for cough. 105 mL Elsia Lasota A, NP  ? azithromycin (ZITHROMAX) 250 MG tablet Take 1 tablet (250 mg total) by mouth daily. Take first 2 tablets together, then 1 every day until finished. 6 tablet Katryn Plummer A, NP  ? albuterol (VENTOLIN HFA) 108 (90 Base) MCG/ACT inhaler Inhale 1-2 puffs into the lungs every 6 (six) hours as needed for wheezing or shortness of breath. 1 each Janace Aris, NP  ? ?  ? ?PDMP not reviewed this encounter. ?  ?Janace Aris, NP ?10/23/21 0932 ? ?

## 2021-10-23 NOTE — Discharge Instructions (Signed)
Treating you for upper respiratory infection ?Take the medications as prescribed.  Follow-up as needed. ?

## 2021-10-23 NOTE — ED Triage Notes (Signed)
Pt reports cough, nasal congestion, sore throat when coughing,  chest discomfort when coughing x 4 days. Benadryl gives no relief.  ?

## 2022-08-23 ENCOUNTER — Ambulatory Visit (INDEPENDENT_AMBULATORY_CARE_PROVIDER_SITE_OTHER): Payer: Self-pay

## 2022-08-23 ENCOUNTER — Encounter: Payer: Self-pay | Admitting: Emergency Medicine

## 2022-08-23 ENCOUNTER — Ambulatory Visit
Admission: EM | Admit: 2022-08-23 | Discharge: 2022-08-23 | Disposition: A | Discharge status: Home / Self Care | Payer: Self-pay | Attending: Urgent Care | Admitting: Urgent Care

## 2022-08-23 DIAGNOSIS — R0789 Other chest pain: Secondary | ICD-10-CM

## 2022-08-23 DIAGNOSIS — J4520 Mild intermittent asthma, uncomplicated: Secondary | ICD-10-CM

## 2022-08-23 DIAGNOSIS — U071 COVID-19: Secondary | ICD-10-CM

## 2022-08-23 DIAGNOSIS — R0602 Shortness of breath: Secondary | ICD-10-CM

## 2022-08-23 DIAGNOSIS — R059 Cough, unspecified: Secondary | ICD-10-CM

## 2022-08-23 DIAGNOSIS — R509 Fever, unspecified: Secondary | ICD-10-CM

## 2022-08-23 MED ORDER — PREDNISONE 20 MG PO TABS: 40.0000 mg | ORAL_TABLET | Freq: Every day | ORAL | 0 refills | Status: DC

## 2022-08-23 MED ORDER — PROMETHAZINE-DM 6.25-15 MG/5ML PO SYRP: 2.5000 mL | ORAL_SOLUTION | Freq: Three times a day (TID) | ORAL | 0 refills | Status: DC | PRN

## 2022-08-23 MED ORDER — BENZONATATE 100 MG PO CAPS: 100.0000 mg | ORAL_CAPSULE | Freq: Three times a day (TID) | ORAL | 0 refills | Status: AC | PRN

## 2022-08-23 MED ORDER — ALBUTEROL SULFATE HFA 108 (90 BASE) MCG/ACT IN AERS: 1.0000 | INHALATION_SPRAY | Freq: Four times a day (QID) | RESPIRATORY_TRACT | 0 refills | Status: DC | PRN

## 2022-08-23 NOTE — ED Triage Notes (Signed)
Chest heaviness.  Tested positive for covid on Saturday.  Fever.  Thinks she may have pneumonia

## 2022-08-23 NOTE — ED Provider Notes (Signed)
[facility]  Note:  This document was prepared using Dragon voice recognition software and may include unintentional dictation errors.  MRN: [id number] DOB: 1970/10/11  Subjective:   Hannah Ward is a 52 y.o. female presenting for 6 day history of acute onset persistent chest heaviness, chest congestion, fever, coughing.  Patient test positive for COVID-19 on Saturday.  Has a history of pneumonia and wants to be checked for that with an x-ray.  No current facility-administered medications for this encounter.  Current Outpatient Medications:    albuterol (VENTOLIN HFA) 108 (90 Base) MCG/ACT inhaler, Inhale 1-2 puffs into the lungs every 6 (six) hours as needed for wheezing or shortness of breath., Disp: 1 each, Rfl: 0   Biotin 1000 MCG tablet, Take 1,000 mcg by mouth 3 (three) times daily., Disp: , Rfl:    diphenhydrAMINE (BENADRYL) 50 MG tablet, Take 50 mg by mouth at bedtime as needed for itching., Disp: , Rfl:    ibuprofen (ADVIL,MOTRIN) 200 MG tablet, Take 600 mg by mouth daily as needed for mild pain or moderate pain., Disp: , Rfl:    Allergies  Allergen Reactions   Penicillins Shortness Of Breath and Swelling    Has patient had a PCN reaction causing immediate rash, facial/tongue/throat swelling, SOB or lightheadedness with hypotension: Yes Has patient had a PCN reaction causing severe rash involving mucus membranes or skin necrosis: No Has patient had a PCN reaction that required hospitalization No Has patient had a PCN reaction occurring within the last 10 years: Non  If all of the above answers are "NO", then may proceed with Cephalosporin use.     History reviewed. No pertinent past medical history.   History reviewed. No pertinent surgical history.  History reviewed. No pertinent family history.  Social History   Tobacco Use   Smoking status: Light Smoker   Smokeless tobacco: Never  Substance Use Topics   Alcohol use: No   Drug use: No     ROS   Objective:   Vitals: BP 132/84 (BP Location: Right Arm)   Pulse 80   Temp 98.1 F (36.7 C) (Oral)   Resp 18   LMP 05/22/2022 (Approximate)   SpO2 96%   Physical Exam Constitutional:      General: She is not in acute distress.    Appearance: Normal appearance. She is well-developed. She is not ill-appearing, toxic-appearing or diaphoretic.  HENT:     Head: Normocephalic and atraumatic.     Nose: Nose normal.     Mouth/Throat:     Mouth: Mucous membranes are moist.  Eyes:     General: No scleral icterus.       Right eye: No discharge.        Left eye: No discharge.     Extraocular Movements: Extraocular movements intact.  Cardiovascular:     Rate and Rhythm: Normal rate and regular rhythm.     Heart sounds: Normal heart sounds. No murmur heard.    No friction rub. No gallop.  Pulmonary:     Effort: Pulmonary effort is normal. No respiratory distress.     Breath sounds: No stridor. No wheezing, rhonchi or rales.  Chest:     Chest wall: No tenderness.  Skin:    General: Skin is warm and dry.  Neurological:     General: No focal deficit present.     Mental Status: She is alert and oriented to person, place, and time.  Psychiatric:        Mood  and Affect: Mood normal.        Behavior: Behavior normal.     DG Chest 2 View  Result Date: 08/23/2022 CLINICAL DATA:  Shortness of breath, cough and fever EXAM: CHEST - 2 VIEW COMPARISON:  09/16/2016 FINDINGS: The heart size and mediastinal contours are within normal limits. Both lungs are clear. The visualized skeletal structures are unremarkable. IMPRESSION: No acute abnormality of the lungs. Electronically Signed   By: Delanna Ahmadi M.D.   On: 08/23/2022 13:17     Assessment and Plan :   PDMP not reviewed this encounter.  1. COVID-19   2. Chest heaviness     Has a history of reactive airways and has previously done well with albuterol and steroids.  Recommended a course of each.  Otherwise continue with  supportive care for COVID-19 infection.  Chest x-ray negative. Counseled patient on potential for adverse effects with medications prescribed/recommended today, ER and return-to-clinic precautions discussed, patient verbalized understanding.    Jaynee Eagles, [place] 08/23/22 1327

## 2023-02-22 DIAGNOSIS — C4359 Malignant melanoma of other part of trunk: Secondary | ICD-10-CM | POA: Diagnosis not present

## 2023-03-14 DIAGNOSIS — J9811 Atelectasis: Secondary | ICD-10-CM | POA: Diagnosis not present

## 2023-03-14 DIAGNOSIS — C4359 Malignant melanoma of other part of trunk: Secondary | ICD-10-CM | POA: Diagnosis not present

## 2023-03-14 DIAGNOSIS — K5730 Diverticulosis of large intestine without perforation or abscess without bleeding: Secondary | ICD-10-CM | POA: Diagnosis not present

## 2023-03-31 DIAGNOSIS — C4359 Malignant melanoma of other part of trunk: Secondary | ICD-10-CM | POA: Diagnosis not present

## 2023-03-31 DIAGNOSIS — Z880 Allergy status to penicillin: Secondary | ICD-10-CM | POA: Diagnosis not present

## 2023-03-31 DIAGNOSIS — L905 Scar conditions and fibrosis of skin: Secondary | ICD-10-CM | POA: Diagnosis not present

## 2023-03-31 DIAGNOSIS — C774 Secondary and unspecified malignant neoplasm of inguinal and lower limb lymph nodes: Secondary | ICD-10-CM | POA: Diagnosis not present

## 2023-03-31 DIAGNOSIS — Z006 Encounter for examination for normal comparison and control in clinical research program: Secondary | ICD-10-CM | POA: Diagnosis not present

## 2023-03-31 DIAGNOSIS — Z87891 Personal history of nicotine dependence: Secondary | ICD-10-CM | POA: Diagnosis not present

## 2023-04-10 DIAGNOSIS — C4359 Malignant melanoma of other part of trunk: Secondary | ICD-10-CM | POA: Diagnosis not present

## 2023-04-10 DIAGNOSIS — Z09 Encounter for follow-up examination after completed treatment for conditions other than malignant neoplasm: Secondary | ICD-10-CM | POA: Diagnosis not present

## 2023-04-14 DIAGNOSIS — F4323 Adjustment disorder with mixed anxiety and depressed mood: Secondary | ICD-10-CM | POA: Diagnosis not present

## 2023-04-14 DIAGNOSIS — G4700 Insomnia, unspecified: Secondary | ICD-10-CM | POA: Diagnosis not present

## 2023-04-14 DIAGNOSIS — C4359 Malignant melanoma of other part of trunk: Secondary | ICD-10-CM | POA: Diagnosis not present

## 2023-04-17 DIAGNOSIS — C4359 Malignant melanoma of other part of trunk: Secondary | ICD-10-CM | POA: Diagnosis not present

## 2023-04-17 DIAGNOSIS — Z09 Encounter for follow-up examination after completed treatment for conditions other than malignant neoplasm: Secondary | ICD-10-CM | POA: Diagnosis not present

## 2023-04-26 DIAGNOSIS — C4359 Malignant melanoma of other part of trunk: Secondary | ICD-10-CM | POA: Diagnosis not present

## 2023-05-01 DIAGNOSIS — Z5181 Encounter for therapeutic drug level monitoring: Secondary | ICD-10-CM | POA: Diagnosis not present

## 2023-05-01 DIAGNOSIS — C4359 Malignant melanoma of other part of trunk: Secondary | ICD-10-CM | POA: Diagnosis not present

## 2023-05-01 DIAGNOSIS — C439 Malignant melanoma of skin, unspecified: Secondary | ICD-10-CM | POA: Diagnosis not present

## 2023-05-01 DIAGNOSIS — F4323 Adjustment disorder with mixed anxiety and depressed mood: Secondary | ICD-10-CM | POA: Diagnosis not present

## 2023-05-01 DIAGNOSIS — Z79899 Other long term (current) drug therapy: Secondary | ICD-10-CM | POA: Diagnosis not present

## 2023-05-15 DIAGNOSIS — F4323 Adjustment disorder with mixed anxiety and depressed mood: Secondary | ICD-10-CM | POA: Diagnosis not present

## 2023-05-15 DIAGNOSIS — G4700 Insomnia, unspecified: Secondary | ICD-10-CM | POA: Diagnosis not present

## 2023-05-17 DIAGNOSIS — Z79899 Other long term (current) drug therapy: Secondary | ICD-10-CM | POA: Diagnosis not present

## 2023-05-17 DIAGNOSIS — R7989 Other specified abnormal findings of blood chemistry: Secondary | ICD-10-CM | POA: Diagnosis not present

## 2023-05-17 DIAGNOSIS — C779 Secondary and unspecified malignant neoplasm of lymph node, unspecified: Secondary | ICD-10-CM | POA: Diagnosis not present

## 2023-05-17 DIAGNOSIS — C439 Malignant melanoma of skin, unspecified: Secondary | ICD-10-CM | POA: Diagnosis not present

## 2023-05-22 DIAGNOSIS — Z803 Family history of malignant neoplasm of breast: Secondary | ICD-10-CM | POA: Diagnosis not present

## 2023-05-22 DIAGNOSIS — C439 Malignant melanoma of skin, unspecified: Secondary | ICD-10-CM | POA: Diagnosis not present

## 2023-05-22 DIAGNOSIS — Z79899 Other long term (current) drug therapy: Secondary | ICD-10-CM | POA: Diagnosis not present

## 2023-05-22 DIAGNOSIS — Z5112 Encounter for antineoplastic immunotherapy: Secondary | ICD-10-CM | POA: Diagnosis not present

## 2023-05-22 DIAGNOSIS — C4359 Malignant melanoma of other part of trunk: Secondary | ICD-10-CM | POA: Diagnosis not present

## 2023-05-22 DIAGNOSIS — Z808 Family history of malignant neoplasm of other organs or systems: Secondary | ICD-10-CM | POA: Diagnosis not present

## 2023-05-22 DIAGNOSIS — R7401 Elevation of levels of liver transaminase levels: Secondary | ICD-10-CM | POA: Diagnosis not present

## 2023-05-23 DIAGNOSIS — Z419 Encounter for procedure for purposes other than remedying health state, unspecified: Secondary | ICD-10-CM | POA: Diagnosis not present

## 2023-06-19 DIAGNOSIS — R7401 Elevation of levels of liver transaminase levels: Secondary | ICD-10-CM | POA: Diagnosis not present

## 2023-06-19 DIAGNOSIS — R002 Palpitations: Secondary | ICD-10-CM | POA: Diagnosis not present

## 2023-06-19 DIAGNOSIS — M79605 Pain in left leg: Secondary | ICD-10-CM | POA: Diagnosis not present

## 2023-06-19 DIAGNOSIS — C4359 Malignant melanoma of other part of trunk: Secondary | ICD-10-CM | POA: Diagnosis not present

## 2023-06-19 DIAGNOSIS — M79604 Pain in right leg: Secondary | ICD-10-CM | POA: Diagnosis not present

## 2023-06-19 DIAGNOSIS — Z79899 Other long term (current) drug therapy: Secondary | ICD-10-CM | POA: Diagnosis not present

## 2023-06-19 DIAGNOSIS — Z5112 Encounter for antineoplastic immunotherapy: Secondary | ICD-10-CM | POA: Diagnosis not present

## 2023-06-19 DIAGNOSIS — E0590 Thyrotoxicosis, unspecified without thyrotoxic crisis or storm: Secondary | ICD-10-CM | POA: Diagnosis not present

## 2023-06-23 DIAGNOSIS — Z419 Encounter for procedure for purposes other than remedying health state, unspecified: Secondary | ICD-10-CM | POA: Diagnosis not present

## 2023-07-03 ENCOUNTER — Ambulatory Visit
Admission: EM | Admit: 2023-07-03 | Discharge: 2023-07-03 | Disposition: A | Discharge status: Home / Self Care | Payer: Medicaid Other

## 2023-07-03 DIAGNOSIS — J22 Unspecified acute lower respiratory infection: Secondary | ICD-10-CM

## 2023-07-03 HISTORY — DX: Disorder of thyroid, unspecified: E07.9

## 2023-07-03 HISTORY — DX: Malignant melanoma of skin, unspecified: C43.9

## 2023-07-03 MED ORDER — DOXYCYCLINE HYCLATE 100 MG PO CAPS: 100.0000 mg | ORAL_CAPSULE | Freq: Two times a day (BID) | ORAL | 0 refills | Status: DC

## 2023-07-03 MED ORDER — ALBUTEROL SULFATE (2.5 MG/3ML) 0.083% IN NEBU: 2.5000 mg | INHALATION_SOLUTION | RESPIRATORY_TRACT | 0 refills | Status: DC | PRN

## 2023-07-03 MED ORDER — FLUTICASONE PROPIONATE HFA 110 MCG/ACT IN AERO: 2.0000 | INHALATION_SPRAY | Freq: Two times a day (BID) | RESPIRATORY_TRACT | 0 refills | Status: AC | PRN

## 2023-07-03 MED ORDER — ALBUTEROL SULFATE HFA 108 (90 BASE) MCG/ACT IN AERS: 2.0000 | INHALATION_SPRAY | RESPIRATORY_TRACT | 0 refills | Status: DC | PRN

## 2023-07-03 MED ORDER — PROMETHAZINE-DM 6.25-15 MG/5ML PO SYRP: 5.0000 mL | ORAL_SOLUTION | Freq: Four times a day (QID) | ORAL | 0 refills | Status: DC | PRN

## 2023-07-03 NOTE — ED Provider Notes (Signed)
[facility]    CSN: [id number] Arrival date & time: 07/03/23  1136      History   Chief Complaint Chief Complaint  Patient presents with   Cough    HPI Hannah Ward is a 52 y.o. female.   Patient presenting today with going on 2 weeks of progressively worsening cough, chest congestion, wheezing, shortness of breath.  Denies fever, chest pain, abdominal pain, nausea vomiting or diarrhea.  History of melanoma currently on immunotherapy.  Does smoke cigarettes but no known history of diagnosed chronic pulmonary disease.  Has been using her albuterol inhaler with minimal relief.    Past Medical History:  Diagnosis Date   Melanoma (HCC)    Thyroid disease     There are no problems to display for this patient.   Past Surgical History:  Procedure Laterality Date   mole removal      OB History   No obstetric history on file.      Home Medications    Prior to Admission medications   Medication Sig Start Date End Date Taking? Authorizing Provider  albuterol (PROVENTIL) (2.5 MG/3ML) 0.083% nebulizer solution Take 3 mLs (2.5 mg total) by nebulization every 4 (four) hours as needed for wheezing or shortness of breath. 07/03/23  Yes Particia Nearing, PA-C  albuterol (VENTOLIN HFA) 108 (90 Base) MCG/ACT inhaler Inhale 2 puffs into the lungs every 4 (four) hours as needed. 07/03/23  Yes Particia Nearing, PA-C  atenolol (TENORMIN) 25 MG tablet Take by mouth daily.   Yes [provider]  doxycycline (VIBRAMYCIN) 100 MG capsule Take 1 capsule (100 mg total) by mouth 2 (two) times daily. 07/03/23  Yes Particia Nearing, PA-C  fluticasone (FLOVENT HFA) 110 MCG/ACT inhaler Inhale 2 puffs into the lungs 2 (two) times daily as needed. 07/03/23  Yes Particia Nearing, PA-C  mirtazapine (REMERON SOL-TAB) 15 MG disintegrating tablet Take 15 mg by mouth at bedtime.   Yes [provider]  promethazine-dextromethorphan (PROMETHAZINE-DM)  6.25-15 MG/5ML syrup Take 5 mLs by mouth 4 (four) times daily as needed. 07/03/23  Yes Particia Nearing, PA-C  albuterol (VENTOLIN HFA) 108 (90 Base) MCG/ACT inhaler Inhale 1-2 puffs into the lungs every 6 (six) hours as needed for wheezing or shortness of breath. 08/23/22   Wallis Bamberg, PA-C  benzonatate (TESSALON) 100 MG capsule Take 1 capsule (100 mg total) by mouth 3 (three) times daily as needed for cough. 08/23/22   Wallis Bamberg, PA-C  Biotin 1000 MCG tablet Take 1,000 mcg by mouth 3 (three) times daily.    [provider]  diphenhydrAMINE (BENADRYL) 50 MG tablet Take 50 mg by mouth at bedtime as needed for itching.    [provider]  ibuprofen (ADVIL,MOTRIN) 200 MG tablet Take 600 mg by mouth daily as needed for mild pain or moderate pain.    [provider]  predniSONE (DELTASONE) 20 MG tablet Take 2 tablets (40 mg total) by mouth daily with breakfast. 08/23/22   Wallis Bamberg, PA-C  promethazine-dextromethorphan (PROMETHAZINE-DM) 6.25-15 MG/5ML syrup Take 2.5 mLs by mouth 3 (three) times daily as needed for cough. 08/23/22   Wallis Bamberg, PA-C    Family History History reviewed. No pertinent family history.  Social History Social History   Tobacco Use   Smoking status: Light Smoker   Smokeless tobacco: Never  Substance Use Topics   Alcohol use: No   Drug use: No     Allergies   Penicillins   Review  of Systems Review of Systems Per HPI  Physical Exam Triage Vital Signs ED Triage Vitals  Encounter Vitals Group     BP 07/03/23 1250 135/87     Systolic BP Percentile --      Diastolic BP Percentile --      Pulse Rate 07/03/23 1250 83     Resp 07/03/23 1250 18     Temp 07/03/23 1250 98.2 F (36.8 C)     Temp src --      SpO2 07/03/23 1250 92 %     Weight --      Height --      Head Circumference --      Peak Flow --      Pain Score 07/03/23 1246 0     Pain Loc --      Pain Education --      Exclude from Growth Chart --    No data  found.  Updated Vital Signs BP 135/87   Pulse 83   Temp 98.2 F (36.8 C)   Resp 18   LMP 05/22/2022 (Approximate)   SpO2 92%   Visual Acuity Right Eye Distance:   Left Eye Distance:   Bilateral Distance:    Right Eye Near:   Left Eye Near:    Bilateral Near:     Physical Exam Vitals and nursing note reviewed.  Constitutional:      Appearance: Normal appearance.  HENT:     Head: Atraumatic.     Right Ear: Tympanic membrane and external ear normal.     Left Ear: Tympanic membrane and external ear normal.     Nose: Nose normal.     Mouth/Throat:     Mouth: Mucous membranes are moist.     Pharynx: No posterior oropharyngeal erythema.  Eyes:     Extraocular Movements: Extraocular movements intact.     Conjunctiva/sclera: Conjunctivae normal.  Cardiovascular:     Rate and Rhythm: Normal rate and regular rhythm.     Heart sounds: Normal heart sounds.  Pulmonary:     Effort: Pulmonary effort is normal.     Breath sounds: Wheezing and rales present.  Musculoskeletal:        General: Normal range of motion.     Cervical back: Normal range of motion and neck supple.  Skin:    General: Skin is warm and dry.  Neurological:     Mental Status: She is alert and oriented to person, place, and time.  Psychiatric:        Mood and Affect: Mood normal.        Thought Content: Thought content normal.      UC Treatments / Results  Labs (all labs ordered are listed, but only abnormal results are displayed) Labs Reviewed - No data to display  EKG   Radiology No results found.  Procedures Procedures (including critical care time)  Medications Ordered in UC Medications - No data to display  Initial Impression / Assessment and Plan / UC Course  I have reviewed the triage vital signs and the nursing notes.  Pertinent labs & imaging results that were available during my care of the patient were reviewed by me and considered in my medical decision making (see chart for  details).     Oxygen saturation 92% on room air, she is overall well-appearing in no acute distress but lungs with wheezes, crackles.  Given duration and worsening course, will start doxycycline, Phenergan DM and refill albuterol neb solution and inhaler.  Will also start Flovent to help with inflammation in the lungs.  Discussed supportive over-the-counter medications, home care and return precautions.  Will forego chest x-ray today as we are treating for a suspected bacterial respiratory infection.  Final Clinical Impressions(s) / UC Diagnoses   Final diagnoses:  Lower respiratory infection   Discharge Instructions   None    ED Prescriptions     Medication Sig Dispense Auth. Provider   doxycycline (VIBRAMYCIN) 100 MG capsule Take 1 capsule (100 mg total) by mouth 2 (two) times daily. 20 capsule Particia Nearing, [place]   promethazine-dextromethorphan (PROMETHAZINE-DM) 6.25-15 MG/5ML syrup Take 5 mLs by mouth 4 (four) times daily as needed. 100 mL Particia Nearing, PA-C   albuterol (VENTOLIN HFA) 108 (90 Base) MCG/ACT inhaler Inhale 2 puffs into the lungs every 4 (four) hours as needed. 18 g Particia Nearing, [place]   albuterol (PROVENTIL) (2.5 MG/3ML) 0.083% nebulizer solution Take 3 mLs (2.5 mg total) by nebulization every 4 (four) hours as needed for wheezing or shortness of breath. 75 mL Particia Nearing, PA-C   fluticasone [facility]) 110 MCG/ACT inhaler Inhale 2 puffs into the lungs 2 (two) times daily as needed. 1 each Particia Nearing, PA-C      PDMP not reviewed this encounter.   Particia Nearing, [place] 07/03/23 1323

## 2023-07-03 NOTE — ED Triage Notes (Signed)
Pt presents with cough and chest congestion since 11/2. She felt like she was getting better but has started feeling worse. She did use an albuterol treatment this am.   Pt had nodular melanoma and her oncologist recommend she get a chest xray and flu/covid test.

## 2023-07-17 DIAGNOSIS — E039 Hypothyroidism, unspecified: Secondary | ICD-10-CM | POA: Diagnosis not present

## 2023-07-17 DIAGNOSIS — E0590 Thyrotoxicosis, unspecified without thyrotoxic crisis or storm: Secondary | ICD-10-CM | POA: Diagnosis not present

## 2023-07-17 DIAGNOSIS — Z5112 Encounter for antineoplastic immunotherapy: Secondary | ICD-10-CM | POA: Diagnosis not present

## 2023-07-17 DIAGNOSIS — C439 Malignant melanoma of skin, unspecified: Secondary | ICD-10-CM | POA: Diagnosis not present

## 2023-07-17 DIAGNOSIS — R059 Cough, unspecified: Secondary | ICD-10-CM | POA: Diagnosis not present

## 2023-07-17 DIAGNOSIS — Z79899 Other long term (current) drug therapy: Secondary | ICD-10-CM | POA: Diagnosis not present

## 2023-07-17 DIAGNOSIS — C4359 Malignant melanoma of other part of trunk: Secondary | ICD-10-CM | POA: Diagnosis not present

## 2023-07-18 DIAGNOSIS — C4359 Malignant melanoma of other part of trunk: Secondary | ICD-10-CM | POA: Diagnosis not present

## 2023-07-23 DIAGNOSIS — Z419 Encounter for procedure for purposes other than remedying health state, unspecified: Secondary | ICD-10-CM | POA: Diagnosis not present

## 2023-07-29 ENCOUNTER — Inpatient Hospital Stay
Admission: RE | Admit: 2023-07-29 | Discharge: 2023-07-29 | Discharge status: Home / Self Care | Payer: Medicaid Other | Source: Ambulatory Visit

## 2023-07-29 VITALS — BP 125/90 | HR 99 | Temp 97.5°F | Resp 18

## 2023-07-29 DIAGNOSIS — R062 Wheezing: Secondary | ICD-10-CM | POA: Diagnosis not present

## 2023-07-29 DIAGNOSIS — R350 Frequency of micturition: Secondary | ICD-10-CM | POA: Diagnosis present

## 2023-07-29 DIAGNOSIS — N3000 Acute cystitis without hematuria: Secondary | ICD-10-CM

## 2023-07-29 LAB — POCT URINALYSIS DIP (MANUAL ENTRY)
Bilirubin, UA: NEGATIVE
Glucose, UA: NEGATIVE mg/dL
Ketones, POC UA: NEGATIVE mg/dL
Nitrite, UA: POSITIVE — AB
Protein Ur, POC: 30 mg/dL — AB
Spec Grav, UA: 1.025 (ref 1.010–1.025)
Urobilinogen, UA: 0.2 U/dL
pH, UA: 6.5 (ref 5.0–8.0)

## 2023-07-29 MED ORDER — ALBUTEROL SULFATE HFA 108 (90 BASE) MCG/ACT IN AERS: 2.0000 | INHALATION_SPRAY | Freq: Four times a day (QID) | RESPIRATORY_TRACT | 0 refills | Status: AC | PRN

## 2023-07-29 MED ORDER — PREDNISONE 20 MG PO TABS: 40.0000 mg | ORAL_TABLET | Freq: Every day | ORAL | 0 refills | Status: AC

## 2023-07-29 MED ORDER — ALBUTEROL SULFATE (2.5 MG/3ML) 0.083% IN NEBU: 2.5000 mg | INHALATION_SOLUTION | Freq: Four times a day (QID) | RESPIRATORY_TRACT | 12 refills | Status: AC | PRN

## 2023-07-29 MED ORDER — NITROFURANTOIN MONOHYD MACRO 100 MG PO CAPS: 100.0000 mg | ORAL_CAPSULE | Freq: Two times a day (BID) | ORAL | 0 refills | Status: AC

## 2023-07-29 NOTE — ED Triage Notes (Signed)
Patient c/o urinary frequency that began  last Monday (patient states she is on immunotherapy). The patient reports she has been having some congestion and wheezing as well.   Home interventions: azo

## 2023-07-29 NOTE — Discharge Instructions (Addendum)
Urinalysis shows that you do have a urinary tract infection.  Culture is pending to ensure you have been treated with the appropriate antibiotic.  If the medication needs to be changed, you will be contacted. Take medications as prescribed. Increase fluids. Ibuprofen or Tylenol for pain, fever, or general discomfort. Develop a toileting schedule that will allow you to urinate at least every 2 hours. Avoid caffeine to include tea, soda, and coffee. -If sexually active, void at least 15 to 20 minutes after sexual intercourse.  For your wheezing: Take medication as prescribed. Recommend using humidifier in your bedroom at nighttime during sleep and sleeping elevated on pillows while symptoms persist. Continue to monitor symptoms for worsening.  If symptoms appear to be worsening to include worsening shortness of breath, difficulty breathing, wheezing, or other concerns, you may follow-up in this clinic or in the emergency department for further evaluation.

## 2023-07-29 NOTE — ED Provider Notes (Signed)
[facility]    CSN: [id number] Arrival date & time: 07/29/23  1208      History   Chief Complaint Chief Complaint  Patient presents with   Urinary Frequency    Entered by patient   Nasal Congestion   Wheezing    HPI Hannah Ward is a 52 y.o. female.   The history is provided by the patient.   Patient presents for complaints of urinary frequency, and pain in her pelvic region has been present for the past week.  She denies fever, chills, chest pain, abdominal pain, nausea, vomiting, diarrhea, vaginal symptoms.  Patient denies history of recurrent urinary tract infections, kidney infections, or kidney stones.  Patient reports she currently is on immunotherapy. Has been using Azo for her symptoms.  Patient also complains of wheezing that started this morning.  Patient states when she woke up this morning, she had to "catch her breath."  She states that she was recently treated for pneumonia last month.  She states that she has not had any new fever, body aches, headache, nasal congestion, runny nose, chest pain, abdominal pain, nausea, vomiting, or diarrhea.  Patient reports that she has used her albuterol inhaler quite frequently since she was diagnosed with pneumonia.  States that she did use her inhaler this morning.  Denies prior history of asthma, or seasonal allergies.  She reports that she quit smoking back in June. Past Medical History:  Diagnosis Date   Melanoma (HCC)    Thyroid disease     There are no problems to display for this patient.   Past Surgical History:  Procedure Laterality Date   mole removal      OB History   No obstetric history on file.      Home Medications    Prior to Admission medications   Medication Sig Start Date End Date Taking? Authorizing Provider  levothyroxine (SYNTHROID) 75 MCG tablet Take 1 tablet by mouth daily. 07/17/23 07/16/24 Yes [provider]  albuterol (PROVENTIL) (2.5 MG/3ML) 0.083% nebulizer  solution Take 3 mLs (2.5 mg total) by nebulization every 6 (six) hours as needed for wheezing or shortness of breath. 07/29/23  Yes Leath-Warren, Sadie Haber, NP  albuterol (VENTOLIN HFA) 108 (90 Base) MCG/ACT inhaler Inhale 2 puffs into the lungs every 6 (six) hours as needed. 07/29/23  Yes Leath-Warren, Sadie Haber, NP  atenolol (TENORMIN) 25 MG tablet Take by mouth daily.    [provider]  benzonatate (TESSALON) 100 MG capsule Take 1 capsule (100 mg total) by mouth 3 (three) times daily as needed for cough. 08/23/22   Wallis Bamberg, PA-C  Biotin 1000 MCG tablet Take 1,000 mcg by mouth 3 (three) times daily.    [provider]  diphenhydrAMINE (BENADRYL) 50 MG tablet Take 50 mg by mouth at bedtime as needed for itching.    [provider]  doxycycline (VIBRAMYCIN) 100 MG capsule Take 1 capsule (100 mg total) by mouth 2 (two) times daily. 07/03/23   Particia Nearing, PA-C  fluticasone (FLOVENT HFA) 110 MCG/ACT inhaler Inhale 2 puffs into the lungs 2 (two) times daily as needed. 07/03/23   Particia Nearing, PA-C  ibuprofen (ADVIL,MOTRIN) 200 MG tablet Take 600 mg by mouth daily as needed for mild pain or moderate pain.    [provider]  mirtazapine (REMERON SOL-TAB) 15 MG disintegrating tablet Take 15 mg by mouth at bedtime.    [provider]  nitrofurantoin, macrocrystal-monohydrate, (MACROBID) 100 MG capsule Take 1  capsule (100 mg total) by mouth 2 (two) times daily for 5 days. 07/29/23 08/03/23 Yes Leath-Warren, Sadie Haber, NP  predniSONE (DELTASONE) 20 MG tablet Take 2 tablets (40 mg total) by mouth daily with breakfast for 5 days. 07/29/23 08/03/23 Yes Leath-Warren, Sadie Haber, NP  promethazine-dextromethorphan (PROMETHAZINE-DM) 6.25-15 MG/5ML syrup Take 2.5 mLs by mouth 3 (three) times daily as needed for cough. 08/23/22   Wallis Bamberg, PA-C  promethazine-dextromethorphan (PROMETHAZINE-DM) 6.25-15 MG/5ML syrup Take 5 mLs by mouth 4 (four) times  daily as needed. 07/03/23   Particia Nearing, PA-C    Family History History reviewed. No pertinent family history.  Social History Social History   Tobacco Use   Smoking status: Light Smoker   Smokeless tobacco: Never  Substance Use Topics   Alcohol use: No   Drug use: No     Allergies   Penicillins   Review of Systems Review of Systems Per HPI  Physical Exam Triage Vital Signs ED Triage Vitals  Encounter Vitals Group     BP 07/29/23 1229 (!) 125/90     Systolic BP Percentile --      Diastolic BP Percentile --      Pulse Rate 07/29/23 1229 99     Resp 07/29/23 1229 18     Temp 07/29/23 1229 (!) 97.5 F (36.4 C)     Temp Source 07/29/23 1229 Oral     SpO2 07/29/23 1229 99 %     Weight --      Height --      Head Circumference --      Peak Flow --      Pain Score 07/29/23 1228 5     Pain Loc --      Pain Education --      Exclude from Growth Chart --    No data found.  Updated Vital Signs BP (!) 125/90 (BP Location: Right Arm)   Pulse 99   Temp (!) 97.5 F (36.4 C) (Oral)   Resp 18   LMP 05/22/2022 (Approximate)   SpO2 99%   Visual Acuity Right Eye Distance:   Left Eye Distance:   Bilateral Distance:    Right Eye Near:   Left Eye Near:    Bilateral Near:     Physical Exam Vitals and nursing note reviewed.  Constitutional:      General: She is not in acute distress.    Appearance: Normal appearance.  HENT:     Head: Normocephalic.     Right Ear: Tympanic membrane, ear canal and external ear normal.     Left Ear: Tympanic membrane, ear canal and external ear normal.     Nose: Nose normal.     Mouth/Throat:     Mouth: Mucous membranes are moist.     Pharynx: No posterior oropharyngeal erythema.  Eyes:     Extraocular Movements: Extraocular movements intact.     Conjunctiva/sclera: Conjunctivae normal.     Pupils: Pupils are equal, round, and reactive to light.  Cardiovascular:     Rate and Rhythm: Normal rate and regular rhythm.      Pulses: Normal pulses.     Heart sounds: Normal heart sounds.  Pulmonary:     Effort: Pulmonary effort is normal.     Breath sounds: Wheezing (faint expiratory wheezing noted in the posterior LUL, RUL and RLL) present. No rhonchi or rales.  Abdominal:     General: Bowel sounds are normal.     Palpations: Abdomen is soft.  Tenderness: There is no abdominal tenderness. There is no right CVA tenderness or left CVA tenderness.  Musculoskeletal:     Cervical back: Normal range of motion.  Lymphadenopathy:     Cervical: No cervical adenopathy.  Skin:    General: Skin is warm and dry.  Neurological:     General: No focal deficit present.     Mental Status: She is alert and oriented to person, place, and time.  Psychiatric:        Mood and Affect: Mood normal.        Behavior: Behavior normal.     UC Treatments / Results  Labs (all labs ordered are listed, but only abnormal results are displayed) Labs Reviewed  POCT URINALYSIS DIP (MANUAL ENTRY) - Abnormal; Notable for the following components:      Result Value   Clarity, UA hazy (*)    Blood, UA moderate (*)    Protein Ur, POC =30 (*)    Nitrite, UA Positive (*)    Leukocytes, UA Moderate (2+) (*)    All other components within normal limits    EKG   Radiology No results found.  Procedures Procedures (including critical care time)  Medications Ordered in UC Medications - No data to display  Initial Impression / Assessment and Plan / UC Course  I have reviewed the triage vital signs and the nursing notes.  Pertinent labs & imaging results that were available during my care of the patient were reviewed by me and considered in my medical decision making (see chart for details).  Urinalysis was positive for urinary tract infection.  Urine culture is pending.  With regard to the wheezing, patient did have faint expiratory wheezing noted on exam.  Will treat patient's UTI with Macrobid 100 mg twice daily for the  next 5 days.  For her wheezing, prednisone 40 mg was prescribed along with an albuterol inhaler and albuterol nebulizer solution.  Supportive care recommendations were provided and discussed with the patient to include over-the-counter analgesics, developing a toileting schedule, avoiding caffeine, use of a humidifier, and sleeping elevated.  Patient was given strict follow-up precautions.  Patient was in agreement with this plan of care and verbalized understanding.  All questions were answered.  Patient stable for discharge.  Final Clinical Impressions(s) / UC Diagnoses   Final diagnoses:  Acute cystitis without hematuria     Discharge Instructions      Urinalysis shows that you do have a urinary tract infection.  Culture is pending to ensure you have been treated with the appropriate antibiotic.  If the medication needs to be changed, you will be contacted. Take medications as prescribed. Increase fluids. Ibuprofen or Tylenol for pain, fever, or general discomfort. Develop a toileting schedule that will allow you to urinate at least every 2 hours. Avoid caffeine to include tea, soda, and coffee. -If sexually active, void at least 15 to 20 minutes after sexual intercourse.  For your wheezing: Take medication as prescribed. Recommend using humidifier in your bedroom at nighttime during sleep and sleeping elevated on pillows while symptoms persist. Continue to monitor symptoms for worsening.  If symptoms appear to be worsening to include worsening shortness of breath, difficulty breathing, wheezing, or other concerns, you may follow-up in this clinic or in the emergency department for further evaluation.       ED Prescriptions     Medication Sig Dispense Auth. Provider   nitrofurantoin, macrocrystal-monohydrate, (MACROBID) 100 MG capsule Take 1 capsule (100 mg total) by  mouth 2 (two) times daily for 5 days. 10 capsule Leath-Warren, Sadie Haber, NP   predniSONE (DELTASONE) 20 MG tablet  Take 2 tablets (40 mg total) by mouth daily with breakfast for 5 days. 10 tablet Leath-Warren, Sadie Haber, NP   albuterol (VENTOLIN HFA) 108 (90 Base) MCG/ACT inhaler Inhale 2 puffs into the lungs every 6 (six) hours as needed. 8 g Leath-Warren, Sadie Haber, NP   albuterol (PROVENTIL) (2.5 MG/3ML) 0.083% nebulizer solution Take 3 mLs (2.5 mg total) by nebulization every 6 (six) hours as needed for wheezing or shortness of breath. 75 mL Leath-Warren, Sadie Haber, NP      PDMP not reviewed this encounter.   Abran Cantor, NP 07/29/23 1410

## 2023-07-31 DIAGNOSIS — J40 Bronchitis, not specified as acute or chronic: Secondary | ICD-10-CM | POA: Diagnosis not present

## 2023-07-31 DIAGNOSIS — C4359 Malignant melanoma of other part of trunk: Secondary | ICD-10-CM | POA: Diagnosis not present

## 2023-07-31 DIAGNOSIS — J432 Centrilobular emphysema: Secondary | ICD-10-CM | POA: Diagnosis not present

## 2023-07-31 DIAGNOSIS — J439 Emphysema, unspecified: Secondary | ICD-10-CM | POA: Diagnosis not present

## 2023-07-31 DIAGNOSIS — J209 Acute bronchitis, unspecified: Secondary | ICD-10-CM | POA: Diagnosis not present

## 2023-08-01 LAB — URINE CULTURE: Culture: 70000 — AB

## 2023-08-09 ENCOUNTER — Other Ambulatory Visit: Payer: Self-pay | Admitting: Family Medicine

## 2023-08-10 ENCOUNTER — Ambulatory Visit
Admission: RE | Admit: 2023-08-10 | Discharge: 2023-08-10 | Disposition: A | Discharge status: Home / Self Care | Payer: Medicaid Other | Source: Ambulatory Visit | Attending: Nurse Practitioner | Admitting: Nurse Practitioner

## 2023-08-10 VITALS — BP 157/1 | HR 94 | Temp 98.1°F | Resp 16

## 2023-08-10 DIAGNOSIS — N3001 Acute cystitis with hematuria: Secondary | ICD-10-CM | POA: Insufficient documentation

## 2023-08-10 LAB — POCT URINALYSIS DIP (MANUAL ENTRY)
Bilirubin, UA: NEGATIVE
Glucose, UA: NEGATIVE mg/dL
Ketones, POC UA: NEGATIVE mg/dL
Leukocytes, UA: NEGATIVE
Nitrite, UA: NEGATIVE
Protein Ur, POC: NEGATIVE mg/dL
Spec Grav, UA: 1.02 (ref 1.010–1.025)
Urobilinogen, UA: 0.2 U/dL
pH, UA: 7 (ref 5.0–8.0)

## 2023-08-10 MED ORDER — CEPHALEXIN 500 MG PO CAPS: 500.0000 mg | ORAL_CAPSULE | Freq: Two times a day (BID) | ORAL | 0 refills | Status: AC

## 2023-08-10 NOTE — Discharge Instructions (Signed)
It sounds like you have a UTI.  Take Keflex as prescribed to treat it.  We will contact you if we need to change the antibiotic.  Continue the great water intake.

## 2023-08-10 NOTE — ED Provider Notes (Signed)
[facility]    CSN: [id number] Arrival date & time: 08/10/23  1330      History   Chief Complaint Chief Complaint  Patient presents with   Urinary Frequency    Entered by patient    HPI Hannah Ward is a 52 y.o. female.   Patient presents today with 3-day history of urinary frequency and urgency, pressure in her suprapubic area, and acute on chronic mid low back pain.  She denies burning with urination, new urinary incontinence, hematuria, abdominal pain, flank pain, fever, nausea/vomiting, and vaginal discharge.  Reports that she had a UTI a couple of weeks ago that did fully improve with Macrobid.  Reports she is undergoing immunotherapy right now for melanoma and has been causing many issues for her including thyroid dysfunction, emphysema and bronchitis.  She thinks may also be causing frequent UTIs.  Reports last immunotherapy treatment is next month.  Reports history of UTI in the past, but not typically this frequently.  Patient reports she is allergic to penicillins, has tolerated Keflex well in the past without any reaction.    Past Medical History:  Diagnosis Date   Melanoma (HCC)    Thyroid disease     There are no active problems to display for this patient.   Past Surgical History:  Procedure Laterality Date   mole removal      OB History   No obstetric history on file.      Home Medications    Prior to Admission medications   Medication Sig Start Date End Date Taking? Authorizing Provider  cephALEXin (KEFLEX) 500 MG capsule Take 1 capsule (500 mg total) by mouth 2 (two) times daily for 5 days. 08/10/23 08/15/23 Yes Valentino Nose, NP  albuterol (PROVENTIL) (2.5 MG/3ML) 0.083% nebulizer solution Take 3 mLs (2.5 mg total) by nebulization every 6 (six) hours as needed for wheezing or shortness of breath. 07/29/23   Leath-Warren, Sadie Haber, NP  albuterol (VENTOLIN HFA) 108 (90 Base) MCG/ACT inhaler Inhale 2 puffs into the lungs every 6  (six) hours as needed. 07/29/23   Leath-Warren, Sadie Haber, NP  atenolol (TENORMIN) 25 MG tablet Take by mouth daily.    [provider]  benzonatate (TESSALON) 100 MG capsule Take 1 capsule (100 mg total) by mouth 3 (three) times daily as needed for cough. 08/23/22   Wallis Bamberg, PA-C  Biotin 1000 MCG tablet Take 1,000 mcg by mouth 3 (three) times daily.    [provider]  diphenhydrAMINE (BENADRYL) 50 MG tablet Take 50 mg by mouth at bedtime as needed for itching.    [provider]  doxycycline (VIBRAMYCIN) 100 MG capsule Take 1 capsule (100 mg total) by mouth 2 (two) times daily. 07/03/23   Particia Nearing, PA-C  fluticasone (FLOVENT HFA) 110 MCG/ACT inhaler Inhale 2 puffs into the lungs 2 (two) times daily as needed. 07/03/23   Particia Nearing, PA-C  ibuprofen (ADVIL,MOTRIN) 200 MG tablet Take 600 mg by mouth daily as needed for mild pain or moderate pain.    [provider]  levothyroxine (SYNTHROID) 75 MCG tablet Take 1 tablet by mouth daily. 07/17/23 07/16/24  [provider]  mirtazapine (REMERON SOL-TAB) 15 MG disintegrating tablet Take 15 mg by mouth at bedtime.    [provider]  promethazine-dextromethorphan (PROMETHAZINE-DM) 6.25-15 MG/5ML syrup Take 2.5 mLs by mouth 3 (three) times daily as needed for cough. 08/23/22   Wallis Bamberg, PA-C  promethazine-dextromethorphan (PROMETHAZINE-DM) 6.25-15 MG/5ML syrup Take 5  mLs by mouth 4 (four) times daily as needed. 07/03/23   Particia Nearing, PA-C    Family History History reviewed. No pertinent family history.  Social History Social History   Tobacco Use   Smoking status: Light Smoker   Smokeless tobacco: Never  Substance Use Topics   Alcohol use: No   Drug use: No     Allergies   Penicillins   Review of Systems Review of Systems Per HPI  Physical Exam Triage Vital Signs ED Triage Vitals  Encounter Vitals Group     BP 08/10/23 1351 (!) 157/1      Systolic BP Percentile --      Diastolic BP Percentile --      Pulse Rate 08/10/23 1351 94     Resp 08/10/23 1351 16     Temp 08/10/23 1351 98.1 F (36.7 C)     Temp Source 08/10/23 1351 Oral     SpO2 08/10/23 1351 98 %     Weight --      Height --      Head Circumference --      Peak Flow --      Pain Score 08/10/23 1352 4     Pain Loc --      Pain Education --      Exclude from Growth Chart --    No data found.  Updated Vital Signs BP (!) 157/1 (BP Location: Right Arm)   Pulse 94   Temp 98.1 F (36.7 C) (Oral)   Resp 16   LMP 05/22/2022 (Approximate)   SpO2 98%   Blood pressure: 157/91  Visual Acuity Right Eye Distance:   Left Eye Distance:   Bilateral Distance:    Right Eye Near:   Left Eye Near:    Bilateral Near:     Physical Exam Vitals and nursing note reviewed.  Constitutional:      General: She is not in acute distress.    Appearance: She is not toxic-appearing.  HENT:     Mouth/Throat:     Mouth: Mucous membranes are moist.     Pharynx: Oropharynx is clear.  Abdominal:     General: Abdomen is flat. Bowel sounds are normal. There is no distension.     Palpations: Abdomen is soft. There is no mass.     Tenderness: There is no abdominal tenderness. There is no right CVA tenderness, left CVA tenderness or guarding.  Skin:    General: Skin is warm and dry.     Coloration: Skin is not jaundiced or pale.     Findings: No erythema.  Neurological:     Mental Status: She is alert and oriented to person, place, and time.     Motor: No weakness.     Gait: Gait normal.  Psychiatric:        Behavior: Behavior is cooperative.      UC Treatments / Results  Labs (all labs ordered are listed, but only abnormal results are displayed) Labs Reviewed  POCT URINALYSIS DIP (MANUAL ENTRY) - Abnormal; Notable for the following components:      Result Value   Clarity, UA hazy (*)    Blood, UA small (*)    All other components within normal limits  URINE  CULTURE    EKG   Radiology No results found.  Procedures Procedures (including critical care time)  Medications Ordered in UC Medications - No data to display  Initial Impression / Assessment and Plan / UC Course  I  have reviewed the triage vital signs and the nursing notes.  Pertinent labs & imaging results that were available during my care of the patient were reviewed by me and considered in my medical decision making (see chart for details).   Patient is well-appearing, normotensive, afebrile, not tachycardic, not tachypneic, oxygenating well on room air.    1. Acute cystitis with hematuria Urine very dilute today, shows small mount of blood and is hazy Urine culture pending No red flags in history or on exam today Supportive care discussed Will treat with Keflex 500 mg twice daily for 5 days Return and ER precautions discussed with patient  The patient was given the opportunity to ask questions.  All questions answered to their satisfaction.  The patient is in agreement to this plan.   Final Clinical Impressions(s) / UC Diagnoses   Final diagnoses:  Acute cystitis with hematuria     Discharge Instructions      It sounds like you have a UTI.  Take Keflex as prescribed to treat it.  We will contact you if we need to change the antibiotic.  Continue the great water intake.      ED Prescriptions     Medication Sig Dispense Auth. Provider   cephALEXin (KEFLEX) 500 MG capsule Take 1 capsule (500 mg total) by mouth 2 (two) times daily for 5 days. 10 capsule Valentino Nose, NP      PDMP not reviewed this encounter.   Valentino Nose, NP 08/11/23 1052

## 2023-08-10 NOTE — ED Triage Notes (Signed)
Pt states urinary frequency and lower abdominal pressure for the past week.  Has been taking AZO at home.

## 2023-08-11 LAB — URINE CULTURE: Culture: <10000 — AB

## 2023-08-23 DIAGNOSIS — Z419 Encounter for procedure for purposes other than remedying health state, unspecified: Secondary | ICD-10-CM | POA: Diagnosis not present

## 2023-08-28 DIAGNOSIS — Z5112 Encounter for antineoplastic immunotherapy: Secondary | ICD-10-CM | POA: Diagnosis not present

## 2023-08-28 DIAGNOSIS — C4359 Malignant melanoma of other part of trunk: Secondary | ICD-10-CM | POA: Diagnosis not present

## 2023-08-28 DIAGNOSIS — C439 Malignant melanoma of skin, unspecified: Secondary | ICD-10-CM | POA: Diagnosis not present

## 2023-08-28 DIAGNOSIS — J439 Emphysema, unspecified: Secondary | ICD-10-CM | POA: Diagnosis not present

## 2023-08-28 DIAGNOSIS — Z79899 Other long term (current) drug therapy: Secondary | ICD-10-CM | POA: Diagnosis not present

## 2023-08-28 DIAGNOSIS — Z87891 Personal history of nicotine dependence: Secondary | ICD-10-CM | POA: Diagnosis not present

## 2023-08-31 DIAGNOSIS — J411 Mucopurulent chronic bronchitis: Secondary | ICD-10-CM | POA: Diagnosis not present

## 2023-08-31 DIAGNOSIS — J439 Emphysema, unspecified: Secondary | ICD-10-CM | POA: Diagnosis not present

## 2023-08-31 DIAGNOSIS — Z124 Encounter for screening for malignant neoplasm of cervix: Secondary | ICD-10-CM | POA: Diagnosis not present

## 2023-08-31 DIAGNOSIS — Z114 Encounter for screening for human immunodeficiency virus [HIV]: Secondary | ICD-10-CM | POA: Diagnosis not present

## 2023-08-31 DIAGNOSIS — Z1231 Encounter for screening mammogram for malignant neoplasm of breast: Secondary | ICD-10-CM | POA: Diagnosis not present

## 2023-08-31 DIAGNOSIS — Z0000 Encounter for general adult medical examination without abnormal findings: Secondary | ICD-10-CM | POA: Diagnosis not present

## 2023-08-31 DIAGNOSIS — Z1211 Encounter for screening for malignant neoplasm of colon: Secondary | ICD-10-CM | POA: Diagnosis not present

## 2023-08-31 DIAGNOSIS — R030 Elevated blood-pressure reading, without diagnosis of hypertension: Secondary | ICD-10-CM | POA: Diagnosis not present

## 2023-08-31 DIAGNOSIS — Z13220 Encounter for screening for lipoid disorders: Secondary | ICD-10-CM | POA: Diagnosis not present

## 2023-08-31 DIAGNOSIS — F4323 Adjustment disorder with mixed anxiety and depressed mood: Secondary | ICD-10-CM | POA: Diagnosis not present

## 2023-08-31 DIAGNOSIS — Z23 Encounter for immunization: Secondary | ICD-10-CM | POA: Diagnosis not present

## 2023-09-15 DIAGNOSIS — Z1231 Encounter for screening mammogram for malignant neoplasm of breast: Secondary | ICD-10-CM | POA: Diagnosis not present

## 2023-09-19 DIAGNOSIS — J439 Emphysema, unspecified: Secondary | ICD-10-CM | POA: Diagnosis not present

## 2023-09-22 DIAGNOSIS — D485 Neoplasm of uncertain behavior of skin: Secondary | ICD-10-CM | POA: Diagnosis not present

## 2023-09-23 DIAGNOSIS — Z419 Encounter for procedure for purposes other than remedying health state, unspecified: Secondary | ICD-10-CM | POA: Diagnosis not present

## 2023-09-25 DIAGNOSIS — R059 Cough, unspecified: Secondary | ICD-10-CM | POA: Diagnosis not present

## 2023-09-25 DIAGNOSIS — Z79890 Hormone replacement therapy (postmenopausal): Secondary | ICD-10-CM | POA: Diagnosis not present

## 2023-09-25 DIAGNOSIS — Z79899 Other long term (current) drug therapy: Secondary | ICD-10-CM | POA: Diagnosis not present

## 2023-09-25 DIAGNOSIS — M549 Dorsalgia, unspecified: Secondary | ICD-10-CM | POA: Diagnosis not present

## 2023-09-25 DIAGNOSIS — E039 Hypothyroidism, unspecified: Secondary | ICD-10-CM | POA: Diagnosis not present

## 2023-09-25 DIAGNOSIS — Z808 Family history of malignant neoplasm of other organs or systems: Secondary | ICD-10-CM | POA: Diagnosis not present

## 2023-09-25 DIAGNOSIS — Z5112 Encounter for antineoplastic immunotherapy: Secondary | ICD-10-CM | POA: Diagnosis not present

## 2023-09-25 DIAGNOSIS — R635 Abnormal weight gain: Secondary | ICD-10-CM | POA: Diagnosis not present

## 2023-09-25 DIAGNOSIS — C4359 Malignant melanoma of other part of trunk: Secondary | ICD-10-CM | POA: Diagnosis not present

## 2023-09-25 DIAGNOSIS — Z87891 Personal history of nicotine dependence: Secondary | ICD-10-CM | POA: Diagnosis not present

## 2023-09-25 DIAGNOSIS — G4700 Insomnia, unspecified: Secondary | ICD-10-CM | POA: Diagnosis not present

## 2023-09-25 DIAGNOSIS — J439 Emphysema, unspecified: Secondary | ICD-10-CM | POA: Diagnosis not present

## 2023-09-26 DIAGNOSIS — Z124 Encounter for screening for malignant neoplasm of cervix: Secondary | ICD-10-CM | POA: Diagnosis not present

## 2023-09-26 DIAGNOSIS — R87611 Atypical squamous cells cannot exclude high grade squamous intraepithelial lesion on cytologic smear of cervix (ASC-H): Secondary | ICD-10-CM | POA: Diagnosis not present

## 2023-09-27 DIAGNOSIS — M48061 Spinal stenosis, lumbar region without neurogenic claudication: Secondary | ICD-10-CM | POA: Diagnosis not present

## 2023-09-27 DIAGNOSIS — M5416 Radiculopathy, lumbar region: Secondary | ICD-10-CM | POA: Diagnosis not present

## 2023-10-03 DIAGNOSIS — M5416 Radiculopathy, lumbar region: Secondary | ICD-10-CM | POA: Diagnosis not present

## 2023-10-16 DIAGNOSIS — R87810 Cervical high risk human papillomavirus (HPV) DNA test positive: Secondary | ICD-10-CM | POA: Diagnosis not present

## 2023-10-18 DIAGNOSIS — N870 Mild cervical dysplasia: Secondary | ICD-10-CM | POA: Diagnosis not present

## 2023-10-18 DIAGNOSIS — B977 Papillomavirus as the cause of diseases classified elsewhere: Secondary | ICD-10-CM | POA: Diagnosis not present

## 2023-10-18 DIAGNOSIS — R87611 Atypical squamous cells cannot exclude high grade squamous intraepithelial lesion on cytologic smear of cervix (ASC-H): Secondary | ICD-10-CM | POA: Diagnosis not present

## 2023-10-18 DIAGNOSIS — N72 Inflammatory disease of cervix uteri: Secondary | ICD-10-CM | POA: Diagnosis not present

## 2023-10-21 DIAGNOSIS — Z419 Encounter for procedure for purposes other than remedying health state, unspecified: Secondary | ICD-10-CM | POA: Diagnosis not present

## 2023-10-23 DIAGNOSIS — I10 Essential (primary) hypertension: Secondary | ICD-10-CM | POA: Diagnosis not present

## 2023-10-23 DIAGNOSIS — F4323 Adjustment disorder with mixed anxiety and depressed mood: Secondary | ICD-10-CM | POA: Diagnosis not present

## 2023-10-23 DIAGNOSIS — C439 Malignant melanoma of skin, unspecified: Secondary | ICD-10-CM | POA: Diagnosis not present

## 2023-10-23 DIAGNOSIS — E785 Hyperlipidemia, unspecified: Secondary | ICD-10-CM | POA: Diagnosis not present

## 2023-10-23 DIAGNOSIS — G4700 Insomnia, unspecified: Secondary | ICD-10-CM | POA: Diagnosis not present

## 2023-10-23 DIAGNOSIS — Z803 Family history of malignant neoplasm of breast: Secondary | ICD-10-CM | POA: Diagnosis not present

## 2023-10-23 DIAGNOSIS — J439 Emphysema, unspecified: Secondary | ICD-10-CM | POA: Diagnosis not present

## 2023-10-23 DIAGNOSIS — Z880 Allergy status to penicillin: Secondary | ICD-10-CM | POA: Diagnosis not present

## 2023-10-23 DIAGNOSIS — Z79899 Other long term (current) drug therapy: Secondary | ICD-10-CM | POA: Diagnosis not present

## 2023-10-23 DIAGNOSIS — R0789 Other chest pain: Secondary | ICD-10-CM | POA: Diagnosis not present

## 2023-10-23 DIAGNOSIS — Z5112 Encounter for antineoplastic immunotherapy: Secondary | ICD-10-CM | POA: Diagnosis not present

## 2023-10-23 DIAGNOSIS — Z808 Family history of malignant neoplasm of other organs or systems: Secondary | ICD-10-CM | POA: Diagnosis not present

## 2023-10-23 DIAGNOSIS — C4359 Malignant melanoma of other part of trunk: Secondary | ICD-10-CM | POA: Diagnosis not present

## 2023-10-23 DIAGNOSIS — E039 Hypothyroidism, unspecified: Secondary | ICD-10-CM | POA: Diagnosis not present

## 2023-10-23 DIAGNOSIS — Z87891 Personal history of nicotine dependence: Secondary | ICD-10-CM | POA: Diagnosis not present

## 2023-10-24 DIAGNOSIS — L814 Other melanin hyperpigmentation: Secondary | ICD-10-CM | POA: Diagnosis not present

## 2023-10-24 DIAGNOSIS — Z85820 Personal history of malignant melanoma of skin: Secondary | ICD-10-CM | POA: Diagnosis not present

## 2023-10-24 DIAGNOSIS — D0372 Melanoma in situ of left lower limb, including hip: Secondary | ICD-10-CM | POA: Diagnosis not present

## 2023-10-24 DIAGNOSIS — L570 Actinic keratosis: Secondary | ICD-10-CM | POA: Diagnosis not present

## 2023-10-24 DIAGNOSIS — D229 Melanocytic nevi, unspecified: Secondary | ICD-10-CM | POA: Diagnosis not present

## 2023-10-24 DIAGNOSIS — L821 Other seborrheic keratosis: Secondary | ICD-10-CM | POA: Diagnosis not present

## 2023-10-24 DIAGNOSIS — D492 Neoplasm of unspecified behavior of bone, soft tissue, and skin: Secondary | ICD-10-CM | POA: Diagnosis not present

## 2023-10-26 DIAGNOSIS — C4359 Malignant melanoma of other part of trunk: Secondary | ICD-10-CM | POA: Diagnosis not present

## 2023-11-10 DIAGNOSIS — R0600 Dyspnea, unspecified: Secondary | ICD-10-CM | POA: Diagnosis not present

## 2023-11-10 DIAGNOSIS — R0789 Other chest pain: Secondary | ICD-10-CM | POA: Diagnosis not present

## 2023-11-13 DIAGNOSIS — D0361 Melanoma in situ of right upper limb, including shoulder: Secondary | ICD-10-CM | POA: Diagnosis not present

## 2023-11-20 DIAGNOSIS — C4359 Malignant melanoma of other part of trunk: Secondary | ICD-10-CM | POA: Diagnosis not present

## 2023-11-20 DIAGNOSIS — E039 Hypothyroidism, unspecified: Secondary | ICD-10-CM | POA: Diagnosis not present

## 2023-11-20 DIAGNOSIS — J449 Chronic obstructive pulmonary disease, unspecified: Secondary | ICD-10-CM | POA: Diagnosis not present

## 2023-11-20 DIAGNOSIS — E785 Hyperlipidemia, unspecified: Secondary | ICD-10-CM | POA: Diagnosis not present

## 2023-11-20 DIAGNOSIS — Z79899 Other long term (current) drug therapy: Secondary | ICD-10-CM | POA: Diagnosis not present

## 2023-11-20 DIAGNOSIS — L97119 Non-pressure chronic ulcer of right thigh with unspecified severity: Secondary | ICD-10-CM | POA: Diagnosis not present

## 2023-11-20 DIAGNOSIS — Z808 Family history of malignant neoplasm of other organs or systems: Secondary | ICD-10-CM | POA: Diagnosis not present

## 2023-11-20 DIAGNOSIS — Z5111 Encounter for antineoplastic chemotherapy: Secondary | ICD-10-CM | POA: Diagnosis not present

## 2023-11-20 DIAGNOSIS — Z5112 Encounter for antineoplastic immunotherapy: Secondary | ICD-10-CM | POA: Diagnosis not present

## 2023-11-20 DIAGNOSIS — L97129 Non-pressure chronic ulcer of left thigh with unspecified severity: Secondary | ICD-10-CM | POA: Diagnosis not present

## 2023-11-24 DIAGNOSIS — J432 Centrilobular emphysema: Secondary | ICD-10-CM | POA: Diagnosis not present

## 2023-11-24 DIAGNOSIS — G4700 Insomnia, unspecified: Secondary | ICD-10-CM | POA: Diagnosis not present

## 2023-11-24 DIAGNOSIS — G8929 Other chronic pain: Secondary | ICD-10-CM | POA: Diagnosis not present

## 2023-11-24 DIAGNOSIS — J439 Emphysema, unspecified: Secondary | ICD-10-CM | POA: Diagnosis not present

## 2023-11-24 DIAGNOSIS — E785 Hyperlipidemia, unspecified: Secondary | ICD-10-CM | POA: Diagnosis not present

## 2023-11-24 DIAGNOSIS — I10 Essential (primary) hypertension: Secondary | ICD-10-CM | POA: Diagnosis not present

## 2023-11-24 DIAGNOSIS — E039 Hypothyroidism, unspecified: Secondary | ICD-10-CM | POA: Diagnosis not present

## 2023-11-24 DIAGNOSIS — Z79899 Other long term (current) drug therapy: Secondary | ICD-10-CM | POA: Diagnosis not present

## 2023-11-24 DIAGNOSIS — C439 Malignant melanoma of skin, unspecified: Secondary | ICD-10-CM | POA: Diagnosis not present

## 2023-11-24 DIAGNOSIS — Z23 Encounter for immunization: Secondary | ICD-10-CM | POA: Diagnosis not present

## 2023-11-24 DIAGNOSIS — Z87891 Personal history of nicotine dependence: Secondary | ICD-10-CM | POA: Diagnosis not present

## 2023-11-24 DIAGNOSIS — Z880 Allergy status to penicillin: Secondary | ICD-10-CM | POA: Diagnosis not present

## 2023-11-24 DIAGNOSIS — R590 Localized enlarged lymph nodes: Secondary | ICD-10-CM | POA: Diagnosis not present

## 2023-11-24 DIAGNOSIS — Z79890 Hormone replacement therapy (postmenopausal): Secondary | ICD-10-CM | POA: Diagnosis not present

## 2023-11-24 DIAGNOSIS — F32A Depression, unspecified: Secondary | ICD-10-CM | POA: Diagnosis not present

## 2023-11-24 DIAGNOSIS — Z85820 Personal history of malignant melanoma of skin: Secondary | ICD-10-CM | POA: Diagnosis not present

## 2023-11-24 DIAGNOSIS — R0789 Other chest pain: Secondary | ICD-10-CM | POA: Diagnosis not present

## 2023-11-27 DIAGNOSIS — E785 Hyperlipidemia, unspecified: Secondary | ICD-10-CM | POA: Diagnosis not present

## 2023-11-29 DIAGNOSIS — K219 Gastro-esophageal reflux disease without esophagitis: Secondary | ICD-10-CM | POA: Diagnosis not present

## 2023-11-29 DIAGNOSIS — D124 Benign neoplasm of descending colon: Secondary | ICD-10-CM | POA: Diagnosis not present

## 2023-11-29 DIAGNOSIS — J4489 Other specified chronic obstructive pulmonary disease: Secondary | ICD-10-CM | POA: Diagnosis not present

## 2023-11-29 DIAGNOSIS — E039 Hypothyroidism, unspecified: Secondary | ICD-10-CM | POA: Diagnosis not present

## 2023-11-29 DIAGNOSIS — Z79899 Other long term (current) drug therapy: Secondary | ICD-10-CM | POA: Diagnosis not present

## 2023-11-29 DIAGNOSIS — K635 Polyp of colon: Secondary | ICD-10-CM | POA: Diagnosis not present

## 2023-11-29 DIAGNOSIS — Z880 Allergy status to penicillin: Secondary | ICD-10-CM | POA: Diagnosis not present

## 2023-11-29 DIAGNOSIS — J439 Emphysema, unspecified: Secondary | ICD-10-CM | POA: Diagnosis not present

## 2023-11-29 DIAGNOSIS — Z1211 Encounter for screening for malignant neoplasm of colon: Secondary | ICD-10-CM | POA: Diagnosis not present

## 2023-11-29 DIAGNOSIS — I10 Essential (primary) hypertension: Secondary | ICD-10-CM | POA: Diagnosis not present

## 2023-11-29 DIAGNOSIS — Z79890 Hormone replacement therapy (postmenopausal): Secondary | ICD-10-CM | POA: Diagnosis not present

## 2023-12-01 DIAGNOSIS — Z1211 Encounter for screening for malignant neoplasm of colon: Secondary | ICD-10-CM | POA: Diagnosis not present

## 2023-12-01 DIAGNOSIS — K635 Polyp of colon: Secondary | ICD-10-CM | POA: Diagnosis not present

## 2023-12-02 DIAGNOSIS — Z419 Encounter for procedure for purposes other than remedying health state, unspecified: Secondary | ICD-10-CM | POA: Diagnosis not present

## 2023-12-09 ENCOUNTER — Ambulatory Visit
Admission: RE | Admit: 2023-12-09 | Discharge: 2023-12-09 | Disposition: A | Discharge status: Home / Self Care | Source: Ambulatory Visit | Attending: Family Medicine

## 2023-12-09 ENCOUNTER — Telehealth: Payer: Self-pay

## 2023-12-09 ENCOUNTER — Telehealth: Payer: Self-pay | Admitting: Family Medicine

## 2023-12-09 ENCOUNTER — Telehealth: Payer: Self-pay | Admitting: Emergency Medicine

## 2023-12-09 VITALS — BP 122/79 | HR 109 | Temp 98.9°F | Resp 18

## 2023-12-09 DIAGNOSIS — J4521 Mild intermittent asthma with (acute) exacerbation: Secondary | ICD-10-CM

## 2023-12-09 DIAGNOSIS — U071 COVID-19: Secondary | ICD-10-CM | POA: Diagnosis not present

## 2023-12-09 HISTORY — DX: Malignant neoplasm of cervix uteri, unspecified: C53.9

## 2023-12-09 LAB — POC COVID19/FLU A&B COMBO
Covid Antigen, POC: POSITIVE — AB
Influenza A Antigen, POC: NEGATIVE
Influenza B Antigen, POC: NEGATIVE

## 2023-12-09 MED ORDER — PAXLOVID (300/100) 20 X 150 MG & 10 X 100MG PO TBPK: 3.0000 | ORAL_TABLET | Freq: Two times a day (BID) | ORAL | 0 refills | Status: AC

## 2023-12-09 MED ORDER — PROMETHAZINE-DM 6.25-15 MG/5ML PO SYRP: 5.0000 mL | ORAL_SOLUTION | Freq: Four times a day (QID) | ORAL | 0 refills | Status: DC | PRN

## 2023-12-09 MED ORDER — FLUTICASONE PROPIONATE 50 MCG/ACT NA SUSP: 1.0000 | Freq: Two times a day (BID) | NASAL | 2 refills | Status: AC

## 2023-12-09 MED ORDER — PAXLOVID (300/100) 20 X 150 MG & 10 X 100MG PO TBPK: 3.0000 | ORAL_TABLET | Freq: Two times a day (BID) | ORAL | 0 refills | Status: DC

## 2023-12-09 MED ORDER — FLUTICASONE PROPIONATE 50 MCG/ACT NA SUSP: 1.0000 | Freq: Two times a day (BID) | NASAL | 2 refills | Status: DC

## 2023-12-09 NOTE — ED Triage Notes (Signed)
 Pt reports she has body aches, throat soreness, wet cough, and fever x 1 day   Has done a neb treatment

## 2023-12-09 NOTE — ED Provider Notes (Signed)
 [facility]    CSN: [id number] Arrival date & time: 12/09/23  1153      History   Chief Complaint Chief Complaint  Patient presents with   Fever    Ears and throat hurt , body is aching. - Entered by patient    HPI Aicha Clingenpeel Stargell is a 53 y.o. female.   Patient presenting today with 1 day history of bodyaches, sore throat, productive cough, fever, chest tightness.  Denies chest pain, shortness of breath, abdominal pain, vomiting, diarrhea.  So far trying albuterol  nebulizer treatments with mild relief in addition to Benadryl.  Recent exposure to COVID.  History of reactive airway disease on inhalers and nebulizer regimen.  She also states she is currently undergoing cancer treatment.    Past Medical History:  Diagnosis Date   Cervical cancer (HCC)    Melanoma (HCC)    Thyroid  disease     There are no active problems to display for this patient.   Past Surgical History:  Procedure Laterality Date   mole removal      OB History   No obstetric history on file.      Home Medications    Prior to Admission medications   Medication Sig Start Date End Date Taking? Authorizing Provider  fluticasone  (FLONASE ) 50 MCG/ACT nasal spray Place 1 spray into both nostrils 2 (two) times daily. 12/09/23  Yes Corbin Dess, PA-C  nirmatrelvir/ritonavir (PAXLOVID , 300/100,) 20 x 150 MG & 10 x 100MG  TBPK Take 3 tablets by mouth 2 (two) times daily for 5 days. Patient GFR is >60. Take nirmatrelvir (150 mg) two tablets twice daily for 5 days and ritonavir (100 mg) one tablet twice daily for 5 days. 12/09/23 12/14/23 Yes Corbin Dess, PA-C  promethazine -dextromethorphan (PROMETHAZINE -DM) 6.25-15 MG/5ML syrup Take 5 mLs by mouth 4 (four) times daily as needed. 12/09/23  Yes Corbin Dess, PA-C  albuterol  (PROVENTIL ) (2.5 MG/3ML) 0.083% nebulizer solution Take 3 mLs (2.5 mg total) by nebulization every 6 (six) hours as needed for wheezing or shortness of  breath. 07/29/23   Leath-Warren, Belen Bowers, NP  albuterol  (VENTOLIN  HFA) 108 (90 Base) MCG/ACT inhaler Inhale 2 puffs into the lungs every 6 (six) hours as needed. 07/29/23   Leath-Warren, Belen Bowers, NP  atenolol (TENORMIN) 25 MG tablet Take by mouth daily.    [provider]  benzonatate  (TESSALON ) 100 MG capsule Take 1 capsule (100 mg total) by mouth 3 (three) times daily as needed for cough. 08/23/22   Adolph Hoop, PA-C  Biotin 1000 MCG tablet Take 1,000 mcg by mouth 3 (three) times daily.    [provider]  diphenhydrAMINE (BENADRYL) 50 MG tablet Take 50 mg by mouth at bedtime as needed for itching.    [provider]  doxycycline  (VIBRAMYCIN ) 100 MG capsule Take 1 capsule (100 mg total) by mouth 2 (two) times daily. 07/03/23   Corbin Dess, PA-C  fluticasone  (FLOVENT  HFA) 110 MCG/ACT inhaler Inhale 2 puffs into the lungs 2 (two) times daily as needed. 07/03/23   Corbin Dess, PA-C  ibuprofen (ADVIL,MOTRIN) 200 MG tablet Take 600 mg by mouth daily as needed for mild pain or moderate pain.    [provider]  levothyroxine (SYNTHROID) 75 MCG tablet Take 1 tablet by mouth daily. 07/17/23 07/16/24  [provider]  mirtazapine (REMERON SOL-TAB) 15 MG disintegrating tablet Take 15 mg by mouth at bedtime.    [provider]  promethazine -dextromethorphan (PROMETHAZINE -DM) 6.25-15 MG/5ML syrup Take  2.5 mLs by mouth 3 (three) times daily as needed for cough. 08/23/22   Adolph Hoop, PA-C  promethazine -dextromethorphan (PROMETHAZINE -DM) 6.25-15 MG/5ML syrup Take 5 mLs by mouth 4 (four) times daily as needed. 07/03/23   Corbin Dess, PA-C    Family History History reviewed. No pertinent family history.  Social History Social History   Tobacco Use   Smoking status: Light Smoker   Smokeless tobacco: Never  Substance Use Topics   Alcohol use: No   Drug use: No     Allergies   Penicillins   Review of  Systems Review of Systems PER HPI  Physical Exam Triage Vital Signs ED Triage Vitals  Encounter Vitals Group     BP 12/09/23 1235 122/79     Systolic BP Percentile --      Diastolic BP Percentile --      Pulse Rate 12/09/23 1235 (!) 109     Resp 12/09/23 1235 18     Temp 12/09/23 1235 98.9 F (37.2 C)     Temp Source 12/09/23 1235 Oral     SpO2 12/09/23 1235 94 %     Weight --      Height --      Head Circumference --      Peak Flow --      Pain Score 12/09/23 1233 5     Pain Loc --      Pain Education --      Exclude from Growth Chart --    No data found.  Updated Vital Signs BP 122/79 (BP Location: Right Arm)   Pulse (!) 109   Temp 98.9 F (37.2 C) (Oral)   Resp 18   LMP 05/22/2022 (Approximate)   SpO2 94%   Visual Acuity Right Eye Distance:   Left Eye Distance:   Bilateral Distance:    Right Eye Near:   Left Eye Near:    Bilateral Near:     Physical Exam Vitals and nursing note reviewed.  Constitutional:      Appearance: Normal appearance.  HENT:     Head: Atraumatic.     Right Ear: Tympanic membrane and external ear normal.     Left Ear: Tympanic membrane and external ear normal.     Nose: Congestion present.     Mouth/Throat:     Mouth: Mucous membranes are moist.     Pharynx: Posterior oropharyngeal erythema present.  Eyes:     Extraocular Movements: Extraocular movements intact.     Conjunctiva/sclera: Conjunctivae normal.  Cardiovascular:     Rate and Rhythm: Normal rate and regular rhythm.     Heart sounds: Normal heart sounds.  Pulmonary:     Effort: Pulmonary effort is normal.     Breath sounds: Normal breath sounds. No wheezing.  Musculoskeletal:        General: Normal range of motion.     Cervical back: Normal range of motion and neck supple.  Skin:    General: Skin is warm and dry.  Neurological:     Mental Status: She is alert and oriented to person, place, and time.  Psychiatric:        Mood and Affect: Mood normal.         Thought Content: Thought content normal.      UC Treatments / Results  Labs (all labs ordered are listed, but only abnormal results are displayed) Labs Reviewed  POC COVID19/FLU A&B COMBO - Abnormal; Notable for the following components:  Result Value   Covid Antigen, POC Positive (*)    All other components within normal limits    EKG   Radiology No results found.  Procedures Procedures (including critical care time)  Medications Ordered in UC Medications - No data to display  Initial Impression / Assessment and Plan / UC Course  I have reviewed the triage vital signs and the nursing notes.  Pertinent labs & imaging results that were available during my care of the patient were reviewed by me and considered in my medical decision making (see chart for details).     Rapid COVID-positive, treat with Paxlovid , continued inhaler regimen, Phenergan  DM, Flonase , supportive home care.  Return for worsening symptoms.  Final Clinical Impressions(s) / UC Diagnoses   Final diagnoses:  COVID-19  Mild intermittent reactive airway disease with acute exacerbation   Discharge Instructions   None    ED Prescriptions     Medication Sig Dispense Auth. Provider   nirmatrelvir/ritonavir (PAXLOVID , 300/100,) 20 x 150 MG & 10 x 100MG  TBPK Take 3 tablets by mouth 2 (two) times daily for 5 days. Patient GFR is >60. Take nirmatrelvir (150 mg) two tablets twice daily for 5 days and ritonavir (100 mg) one tablet twice daily for 5 days. 30 tablet Corbin Dess, PA-C   promethazine -dextromethorphan (PROMETHAZINE -DM) 6.25-15 MG/5ML syrup Take 5 mLs by mouth 4 (four) times daily as needed. 100 mL Corbin Dess, PA-C   fluticasone  (FLONASE ) 50 MCG/ACT nasal spray Place 1 spray into both nostrils 2 (two) times daily. 16 g Corbin Dess, [place]      PDMP not reviewed this encounter.   Corbin Dess, [place] 12/09/23 1324

## 2023-12-09 NOTE — Telephone Encounter (Signed)
 Paper Paxlovid  prescription printed per patient request

## 2023-12-09 NOTE — Telephone Encounter (Signed)
 Pt stated [facility] on [address] was closed and wanted scripts sent to [facility] on [address]. Meds sent

## 2023-12-09 NOTE — Telephone Encounter (Signed)
 Pharmacy change for Paxlovid  per patient request

## 2023-12-18 DIAGNOSIS — E039 Hypothyroidism, unspecified: Secondary | ICD-10-CM | POA: Diagnosis not present

## 2023-12-18 DIAGNOSIS — R21 Rash and other nonspecific skin eruption: Secondary | ICD-10-CM | POA: Diagnosis not present

## 2023-12-18 DIAGNOSIS — C774 Secondary and unspecified malignant neoplasm of inguinal and lower limb lymph nodes: Secondary | ICD-10-CM | POA: Diagnosis not present

## 2023-12-18 DIAGNOSIS — Z808 Family history of malignant neoplasm of other organs or systems: Secondary | ICD-10-CM | POA: Diagnosis not present

## 2023-12-18 DIAGNOSIS — E785 Hyperlipidemia, unspecified: Secondary | ICD-10-CM | POA: Diagnosis not present

## 2023-12-18 DIAGNOSIS — C4359 Malignant melanoma of other part of trunk: Secondary | ICD-10-CM | POA: Diagnosis not present

## 2023-12-18 DIAGNOSIS — Z79890 Hormone replacement therapy (postmenopausal): Secondary | ICD-10-CM | POA: Diagnosis not present

## 2023-12-18 DIAGNOSIS — R682 Dry mouth, unspecified: Secondary | ICD-10-CM | POA: Diagnosis not present

## 2023-12-18 DIAGNOSIS — Z5112 Encounter for antineoplastic immunotherapy: Secondary | ICD-10-CM | POA: Diagnosis not present

## 2023-12-18 DIAGNOSIS — Z79899 Other long term (current) drug therapy: Secondary | ICD-10-CM | POA: Diagnosis not present

## 2023-12-20 DIAGNOSIS — L578 Other skin changes due to chronic exposure to nonionizing radiation: Secondary | ICD-10-CM | POA: Diagnosis not present

## 2023-12-20 DIAGNOSIS — D0372 Melanoma in situ of left lower limb, including hip: Secondary | ICD-10-CM | POA: Diagnosis not present

## 2023-12-20 DIAGNOSIS — L814 Other melanin hyperpigmentation: Secondary | ICD-10-CM | POA: Diagnosis not present

## 2024-01-01 DIAGNOSIS — R933 Abnormal findings on diagnostic imaging of other parts of digestive tract: Secondary | ICD-10-CM | POA: Diagnosis not present

## 2024-01-01 DIAGNOSIS — R932 Abnormal findings on diagnostic imaging of liver and biliary tract: Secondary | ICD-10-CM | POA: Diagnosis not present

## 2024-01-01 DIAGNOSIS — Z419 Encounter for procedure for purposes other than remedying health state, unspecified: Secondary | ICD-10-CM | POA: Diagnosis not present

## 2024-01-02 ENCOUNTER — Encounter: Payer: Self-pay | Admitting: Emergency Medicine

## 2024-01-02 ENCOUNTER — Other Ambulatory Visit: Payer: Self-pay

## 2024-01-02 ENCOUNTER — Ambulatory Visit
Admission: EM | Admit: 2024-01-02 | Discharge: 2024-01-02 | Disposition: A | Discharge status: Home / Self Care | Attending: Nurse Practitioner | Admitting: Nurse Practitioner

## 2024-01-02 DIAGNOSIS — L03116 Cellulitis of left lower limb: Secondary | ICD-10-CM | POA: Diagnosis not present

## 2024-01-02 HISTORY — DX: Unspecified asthma, uncomplicated: J45.909

## 2024-01-02 MED ORDER — DOXYCYCLINE HYCLATE 100 MG PO CAPS: 100.0000 mg | ORAL_CAPSULE | Freq: Two times a day (BID) | ORAL | 0 refills | Status: AC

## 2024-01-02 NOTE — Discharge Instructions (Addendum)
 I am concerned you have infection around the incision in your foot.  Take the doxycycline  twice daily for 7 days as prescribed to treat it.  Continue keeping the area clean and apply the Vaseline to the foot.  Please also call your surgeon to schedule a follow up as soon as able.

## 2024-01-02 NOTE — ED Triage Notes (Addendum)
 Pt reports had procedure related to melanoma in situ on left foot Wednesday and states was told to change dressing in morning and night. Pt reports has been doing that but reports noticed an odor and "green ring" around incision site yesterday. Denies any fevers, chills, nausea.

## 2024-01-02 NOTE — ED Provider Notes (Signed)
 [facility]    CSN: [id number] Arrival date & time: 01/02/24  1610      History   Chief Complaint Chief Complaint  Patient presents with   Wound Check    HPI Hannah Ward is a 53 y.o. female.   Patient presents today with concern for wound infection on left foot.  Reports she had melanoma surgery 6 days ago and has been cleaning the wound as recommended by the surgeon and applying Vaseline to the wound.  She has been keeping it covered.  Reports this morning when she went to clean and change the bandage, she noticed an odor coming from the wound as well as green discoloration.  She denies fever, nausea/vomiting, body aches or chills.  She reports the area is very painful and she is taking ibuprofen 800 mg 3 times daily with minimal temporary improvement in pain.    Past Medical History:  Diagnosis Date   Asthma    Cervical cancer (HCC)    Melanoma (HCC)    Thyroid  disease     There are no active problems to display for this patient.   Past Surgical History:  Procedure Laterality Date   mole removal      OB History   No obstetric history on file.      Home Medications    Prior to Admission medications   Medication Sig Start Date End Date Taking? Authorizing Provider  doxycycline  (VIBRAMYCIN ) 100 MG capsule Take 1 capsule (100 mg total) by mouth 2 (two) times daily for 7 days. 01/02/24 01/09/24 Yes Wilhemena Harbour, NP  albuterol  (PROVENTIL ) (2.5 MG/3ML) 0.083% nebulizer solution Take 3 mLs (2.5 mg total) by nebulization every 6 (six) hours as needed for wheezing or shortness of breath. 07/29/23   Leath-Warren, Belen Bowers, NP  albuterol  (VENTOLIN  HFA) 108 (90 Base) MCG/ACT inhaler Inhale 2 puffs into the lungs every 6 (six) hours as needed. 07/29/23   Leath-Warren, Belen Bowers, NP  atenolol (TENORMIN) 25 MG tablet Take by mouth daily.    [provider]  benzonatate  (TESSALON ) 100 MG capsule Take 1 capsule (100 mg total) by mouth 3 (three) times  daily as needed for cough. 08/23/22   Adolph Hoop, PA-C  Biotin 1000 MCG tablet Take 1,000 mcg by mouth 3 (three) times daily.    [provider]  diphenhydrAMINE (BENADRYL) 50 MG tablet Take 50 mg by mouth at bedtime as needed for itching.    [provider]  fluticasone  (FLONASE ) 50 MCG/ACT nasal spray Place 1 spray into both nostrils 2 (two) times daily. 12/09/23   Corbin Dess, PA-C  fluticasone  (FLOVENT  HFA) 110 MCG/ACT inhaler Inhale 2 puffs into the lungs 2 (two) times daily as needed. 07/03/23   Corbin Dess, PA-C  ibuprofen (ADVIL,MOTRIN) 200 MG tablet Take 600 mg by mouth daily as needed for mild pain or moderate pain.    [provider]  levothyroxine (SYNTHROID) 75 MCG tablet Take 1 tablet by mouth daily. 07/17/23 07/16/24  [provider]  mirtazapine (REMERON SOL-TAB) 15 MG disintegrating tablet Take 15 mg by mouth at bedtime.    [provider]    Family History History reviewed. No pertinent family history.  Social History Social History   Tobacco Use   Smoking status: Light Smoker   Smokeless tobacco: Never  Substance Use Topics   Alcohol use: No   Drug use: No     Allergies   Penicillins   Review of Systems Review of  Systems Per HPI  Physical Exam Triage Vital Signs ED Triage Vitals  Encounter Vitals Group     BP 01/02/24 1644 (!) 130/94     Systolic BP Percentile --      Diastolic BP Percentile --      Pulse Rate 01/02/24 1644 97     Resp 01/02/24 1644 20     Temp 01/02/24 1644 98.9 F (37.2 C)     Temp Source 01/02/24 1644 Oral     SpO2 01/02/24 1644 97 %     Weight --      Height --      Head Circumference --      Peak Flow --      Pain Score 01/02/24 1640 8     Pain Loc --      Pain Education --      Exclude from Growth Chart --    No data found.  Updated Vital Signs BP (!) 130/94 (BP Location: Right Arm)   Pulse 97   Temp 98.9 F (37.2 C) (Oral)   Resp 20   LMP  05/22/2022 (Approximate)   SpO2 97%   Visual Acuity Right Eye Distance:   Left Eye Distance:   Bilateral Distance:    Right Eye Near:   Left Eye Near:    Bilateral Near:     Physical Exam Vitals and nursing note reviewed.  Constitutional:      General: She is not in acute distress.    Appearance: Normal appearance. She is not toxic-appearing.  HENT:     Mouth/Throat:     Mouth: Mucous membranes are moist.     Pharynx: Oropharynx is clear.  Pulmonary:     Effort: Pulmonary effort is normal. No respiratory distress.  Feet:     Comments: Wound noted to left foot; see photograph below.  There is edema and tenderness to touch surrounding the wound.  No erythema, fluctuance, or warmth.  Green border noted to wound edges.  Skin:    General: Skin is warm and dry.     Capillary Refill: Capillary refill takes less than 2 seconds.     Findings: Wound present.  Neurological:     Mental Status: She is alert and oriented to person, place, and time.  Psychiatric:        Behavior: Behavior is cooperative.      UC Treatments / Results  Labs (all labs ordered are listed, but only abnormal results are displayed) Labs Reviewed - No data to display  EKG   Radiology No results found.  Procedures Procedures (including critical care time)  Medications Ordered in UC Medications - No data to display  Initial Impression / Assessment and Plan / UC Course  I have reviewed the triage vital signs and the nursing notes.  Pertinent labs & imaging results that were available during my care of the patient were reviewed by me and considered in my medical decision making (see chart for details).   Patient is well-appearing, normotensive, afebrile, not tachycardic, not tachypneic, oxygenating well on room air.    1. Cellulitis of left foot Vitals and exam are stable today Will treat patient with doxycycline  twice daily for 7 days I recommended very close follow-up with dermatology/surgeon  who performed procedure for follow-up later this week-patient will reach out to schedule  The patient was given the opportunity to ask questions.  All questions answered to their satisfaction.  The patient is in agreement to this plan.   Final Clinical  Impressions(s) / UC Diagnoses   Final diagnoses:  Cellulitis of left foot   Discharge Instructions      I am concerned you have infection around the incision in your foot.  Take the doxycycline  twice daily for 7 days as prescribed to treat it.  Continue keeping the area clean and apply the Vaseline to the foot.  Please also call your surgeon to schedule a follow up as soon as able.   ED Prescriptions     Medication Sig Dispense Auth. Provider   doxycycline  (VIBRAMYCIN ) 100 MG capsule Take 1 capsule (100 mg total) by mouth 2 (two) times daily for 7 days. 14 capsule Wilhemena Harbour, NP      PDMP not reviewed this encounter.   Wilhemena Harbour, NP 01/02/24 1723

## 2024-01-08 DIAGNOSIS — Z85828 Personal history of other malignant neoplasm of skin: Secondary | ICD-10-CM | POA: Diagnosis not present

## 2024-01-15 ENCOUNTER — Other Ambulatory Visit: Payer: Self-pay | Admitting: Family Medicine

## 2024-01-17 DIAGNOSIS — C4359 Malignant melanoma of other part of trunk: Secondary | ICD-10-CM | POA: Diagnosis not present

## 2024-01-17 DIAGNOSIS — C4361 Malignant melanoma of right upper limb, including shoulder: Secondary | ICD-10-CM | POA: Diagnosis not present

## 2024-01-17 DIAGNOSIS — Z79899 Other long term (current) drug therapy: Secondary | ICD-10-CM | POA: Diagnosis not present

## 2024-02-01 DIAGNOSIS — Z419 Encounter for procedure for purposes other than remedying health state, unspecified: Secondary | ICD-10-CM | POA: Diagnosis not present

## 2024-02-07 DIAGNOSIS — Z79899 Other long term (current) drug therapy: Secondary | ICD-10-CM | POA: Diagnosis not present

## 2024-02-08 DIAGNOSIS — M48061 Spinal stenosis, lumbar region without neurogenic claudication: Secondary | ICD-10-CM | POA: Diagnosis not present

## 2024-02-08 DIAGNOSIS — M5416 Radiculopathy, lumbar region: Secondary | ICD-10-CM | POA: Diagnosis not present

## 2024-02-12 DIAGNOSIS — H5213 Myopia, bilateral: Secondary | ICD-10-CM | POA: Diagnosis not present

## 2024-02-12 DIAGNOSIS — D696 Thrombocytopenia, unspecified: Secondary | ICD-10-CM | POA: Diagnosis not present

## 2024-02-13 DIAGNOSIS — M5416 Radiculopathy, lumbar region: Secondary | ICD-10-CM | POA: Diagnosis not present

## 2024-02-19 DIAGNOSIS — Z79899 Other long term (current) drug therapy: Secondary | ICD-10-CM | POA: Diagnosis not present

## 2024-02-19 DIAGNOSIS — C4359 Malignant melanoma of other part of trunk: Secondary | ICD-10-CM | POA: Diagnosis not present

## 2024-02-19 DIAGNOSIS — C4361 Malignant melanoma of right upper limb, including shoulder: Secondary | ICD-10-CM | POA: Diagnosis not present

## 2024-02-22 DIAGNOSIS — D229 Melanocytic nevi, unspecified: Secondary | ICD-10-CM | POA: Diagnosis not present

## 2024-02-22 DIAGNOSIS — Z85828 Personal history of other malignant neoplasm of skin: Secondary | ICD-10-CM | POA: Diagnosis not present

## 2024-02-22 DIAGNOSIS — L821 Other seborrheic keratosis: Secondary | ICD-10-CM | POA: Diagnosis not present

## 2024-02-22 DIAGNOSIS — C4359 Malignant melanoma of other part of trunk: Secondary | ICD-10-CM | POA: Diagnosis not present

## 2024-02-22 DIAGNOSIS — L814 Other melanin hyperpigmentation: Secondary | ICD-10-CM | POA: Diagnosis not present

## 2024-02-22 DIAGNOSIS — Z85820 Personal history of malignant melanoma of skin: Secondary | ICD-10-CM | POA: Diagnosis not present

## 2024-02-29 DIAGNOSIS — S42291A Other displaced fracture of upper end of right humerus, initial encounter for closed fracture: Secondary | ICD-10-CM | POA: Diagnosis not present

## 2024-03-02 DIAGNOSIS — Z419 Encounter for procedure for purposes other than remedying health state, unspecified: Secondary | ICD-10-CM | POA: Diagnosis not present

## 2024-03-14 DIAGNOSIS — M25511 Pain in right shoulder: Secondary | ICD-10-CM | POA: Diagnosis not present

## 2024-03-18 DIAGNOSIS — G4700 Insomnia, unspecified: Secondary | ICD-10-CM | POA: Diagnosis not present

## 2024-03-18 DIAGNOSIS — C4359 Malignant melanoma of other part of trunk: Secondary | ICD-10-CM | POA: Diagnosis not present

## 2024-03-18 DIAGNOSIS — Z79899 Other long term (current) drug therapy: Secondary | ICD-10-CM | POA: Diagnosis not present

## 2024-03-18 DIAGNOSIS — S42201D Unspecified fracture of upper end of right humerus, subsequent encounter for fracture with routine healing: Secondary | ICD-10-CM | POA: Diagnosis not present

## 2024-03-18 DIAGNOSIS — E039 Hypothyroidism, unspecified: Secondary | ICD-10-CM | POA: Diagnosis not present

## 2024-03-18 DIAGNOSIS — J439 Emphysema, unspecified: Secondary | ICD-10-CM | POA: Diagnosis not present

## 2024-03-18 DIAGNOSIS — E785 Hyperlipidemia, unspecified: Secondary | ICD-10-CM | POA: Diagnosis not present

## 2024-03-18 DIAGNOSIS — F32A Depression, unspecified: Secondary | ICD-10-CM | POA: Diagnosis not present

## 2024-03-18 DIAGNOSIS — I10 Essential (primary) hypertension: Secondary | ICD-10-CM | POA: Diagnosis not present

## 2024-03-21 DIAGNOSIS — S42291A Other displaced fracture of upper end of right humerus, initial encounter for closed fracture: Secondary | ICD-10-CM | POA: Diagnosis not present

## 2024-03-25 ENCOUNTER — Ambulatory Visit (HOSPITAL_COMMUNITY): Attending: Otolaryngology | Admitting: Occupational Therapy

## 2024-03-25 ENCOUNTER — Encounter (HOSPITAL_COMMUNITY): Payer: Self-pay | Admitting: Occupational Therapy

## 2024-03-25 DIAGNOSIS — M25511 Pain in right shoulder: Secondary | ICD-10-CM | POA: Diagnosis not present

## 2024-03-25 DIAGNOSIS — Z79899 Other long term (current) drug therapy: Secondary | ICD-10-CM | POA: Diagnosis not present

## 2024-03-25 DIAGNOSIS — R29898 Other symptoms and signs involving the musculoskeletal system: Secondary | ICD-10-CM | POA: Insufficient documentation

## 2024-03-25 DIAGNOSIS — M25611 Stiffness of right shoulder, not elsewhere classified: Secondary | ICD-10-CM | POA: Diagnosis not present

## 2024-03-25 NOTE — Patient Instructions (Signed)

## 2024-03-25 NOTE — Therapy (Signed)
 OUTPATIENT OCCUPATIONAL THERAPY ORTHO EVALUATION  Patient Name: Hannah Ward MRN: [id number] DOB:March 17, 1971, 53 y.o., female Today's Date: 03/25/2024   END OF SESSION:  OT End of Session - 03/25/24 1301     Visit Number 1    Number of Visits 13    Date for OT Re-Evaluation 05/10/24    Authorization Type [employer]    OT Start Time 1122    OT Stop Time 1155    OT Time Calculation (min) 33 min    Activity Tolerance Patient tolerated treatment well    Behavior During Therapy WFL for tasks assessed/performed          Past Medical History:  Diagnosis Date   Asthma    Cervical cancer (HCC)    Melanoma (HCC)    Thyroid  disease    Past Surgical History:  Procedure Laterality Date   mole removal     There are no active problems to display for this patient.   PCP: Johnetta Savant, PA-C REFERRING PROVIDER: Johnson Reusing, PA-C  ONSET DATE: 02/29/24  REFERRING DIAG: D57.708J (ICD-10-CM) - Other displaced fracture of upper end of right humerus, initial encounter for closed fracture   THERAPY DIAG:  Acute pain of right shoulder  Shoulder stiffness, right  Other symptoms and signs involving the musculoskeletal system  Rationale for Evaluation and Treatment: Rehabilitation  SUBJECTIVE:   SUBJECTIVE STATEMENT: It hurts really bad Pt accompanied by: self  PERTINENT HISTORY: Pt fell at work and sustained a minimally displaced fracture. No surgery needed at this time.   PRECAUTIONS: Shoulder  WEIGHT BEARING RESTRICTIONS: Yes >1#  PAIN:  Are you having pain? Yes: NPRS scale: 6/10 Pain location: deltoid region Pain description: Constant ache Aggravating factors: movement Relieving factors: nothing  FALLS: Has patient fallen in last 6 months? Yes. Number of falls 1  PLOF: Independent  PATIENT GOALS: To reduce pain and improve mobility  NEXT MD VISIT: 04/16/24  OBJECTIVE:   HAND DOMINANCE: Right  ADLs: Overall ADLs: pt unable to reach up or back with  RUE, limiting dressing, bathing, grooming, as well as IADL's such as dishes, cooking, cleaning, and other house tasks. Pt reports every 30-45 mins due to pain when trying to sleep.   FUNCTIONAL OUTCOME MEASURES: Quick Dash: 72.73  UPPER EXTREMITY ROM:       Assessed in supine, er/IR adducted  Passive ROM Right eval  Shoulder flexion 113  Shoulder abduction 90  Shoulder internal rotation 90  Shoulder external rotation 30  (Blank rows = not tested)    UPPER EXTREMITY MMT:     Assessed in seated, er/IR adducted  MMT Right eval  Shoulder flexion   Shoulder abduction   Shoulder internal rotation   Shoulder external rotation   (Blank rows = not tested)  SENSATION: WFL  EDEMA: Mild swelling noted around biceps to triceps region  OBSERVATIONS: Bruising noted on entirety of bicep and small area along superior region of triceps.    TODAY'S TREATMENT:  DATE:   03/25/24 -Table Slides: flexion, abduction, er, x10 -Pendulums: 2x30    PATIENT EDUCATION: Education details: Table Slides and Pendulums Person educated: Patient Education method: Explanation, Demonstration, and Handouts Education comprehension: verbalized understanding and returned demonstration  HOME EXERCISE PROGRAM: 8/4: Table Slides and Pendulums  GOALS: Goals reviewed with patient? Yes   SHORT TERM GOALS: Target date: 04/10/24  Pt will be provided with and educated on HEP to improve mobility in RUE required for use during ADL completion.   Goal status: INITIAL  2.  Pt will increase RUE P/ROM by 30 degrees to improve ability to use RUE during dressing tasks with minimal compensatory techniques.   Goal status: INITIAL  3.  Pt will increase RUE strength to 3+/5 to improve ability to reach for items at waist to chest height during bathing and grooming tasks.   Goal status:  INITIAL  LONG TERM GOALS: Target date: 05/10/24  Pt will decrease pain in RUE to 3/10 or less to improve ability to sleep for 2+ consecutive hours without waking due to pain.   Goal status: INITIAL  2.  Pt will decrease RUE fascial restrictions to min amounts or less to improve mobility required for functional reaching tasks.   Goal status: INITIAL  3.  Pt will increase RUE A/ROM by 35 degrees to improve ability to use RUE when reaching overhead or behind back during dressing and bathing tasks.   Goal status: INITIAL  4.  Pt will increase RUE strength to 5/5 or greater to improve ability to use RUE when lifting or carrying items during meal preparation/housework/yardwork tasks.   Goal status: INITIAL  5.  Pt will return to highest level of function using RUE as dominant during functional task completion.   Goal status: INITIAL   ASSESSMENT:  CLINICAL IMPRESSION: Patient is a 53 y.o. female who was seen today for occupational therapy evaluation for displaced R humerus fracture. Pt presents with increased pain and fascial restrictions, decreased ROM, strength, and functional use of the RUE.   PERFORMANCE DEFICITS: in functional skills including in functional skills including ADLs, IADLs, coordination, tone, ROM, strength, pain, fascial restrictions, muscle spasms, and UE functional use.  IMPAIRMENTS: are limiting patient from ADLs, IADLs, rest and sleep, work, leisure, and social participation.   COMORBIDITIES: has no other co-morbidities that affects occupational performance. Patient will benefit from skilled OT to address above impairments and improve overall function.  MODIFICATION OR ASSISTANCE TO COMPLETE EVALUATION: Min-Moderate modification of tasks or assist with assess necessary to complete an evaluation.  OT OCCUPATIONAL PROFILE AND HISTORY: Problem focused assessment: Including review of records relating to presenting problem.  CLINICAL DECISION MAKING: LOW - limited  treatment options, no task modification necessary  REHAB POTENTIAL: Good  EVALUATION COMPLEXITY: Low      PLAN:  OT FREQUENCY: 2x/week  OT DURATION: 6 weeks  PLANNED INTERVENTIONS: 97168 OT Re-evaluation, 97535 self care/ADL training, 02889 therapeutic exercise, 97530 therapeutic activity, 97112 neuromuscular re-education, 97140 manual therapy, 97035 ultrasound, 97010 moist heat, 97032 electrical stimulation (manual), passive range of motion, functional mobility training, energy conservation, coping strategies training, patient/family education, and DME and/or AE instructions  RECOMMENDED OTHER SERVICES: N/A  CONSULTED AND AGREED WITH PLAN OF CARE: Patient  PLAN FOR NEXT SESSION: Manual Therapy, P/ROM, Thumb Tacs, [address]  [place], OTR/L [facility] [phone] Valentin Jillyn Nightingale, OT 03/25/2024, 1:03 PM

## 2024-03-28 DIAGNOSIS — M5416 Radiculopathy, lumbar region: Secondary | ICD-10-CM | POA: Diagnosis not present

## 2024-03-29 ENCOUNTER — Encounter (HOSPITAL_COMMUNITY): Payer: Self-pay | Admitting: Occupational Therapy

## 2024-03-29 ENCOUNTER — Ambulatory Visit (HOSPITAL_COMMUNITY): Admitting: Occupational Therapy

## 2024-03-29 DIAGNOSIS — M25611 Stiffness of right shoulder, not elsewhere classified: Secondary | ICD-10-CM

## 2024-03-29 DIAGNOSIS — M25511 Pain in right shoulder: Secondary | ICD-10-CM | POA: Diagnosis not present

## 2024-03-29 DIAGNOSIS — R29898 Other symptoms and signs involving the musculoskeletal system: Secondary | ICD-10-CM | POA: Diagnosis not present

## 2024-03-29 NOTE — Patient Instructions (Signed)

## 2024-03-29 NOTE — Therapy (Signed)
 OUTPATIENT OCCUPATIONAL THERAPY ORTHO TREATMENT NOTE  Patient Name: Hannah Ward MRN: [id number] DOB:1971/02/15, 53 y.o., female Today's Date: 03/29/2024   END OF SESSION:  OT End of Session - 03/29/24 1201     Visit Number 2    Number of Visits 13    Date for OT Re-Evaluation 05/10/24    Authorization Type [employer]    OT Start Time 1118    OT Stop Time 1201    OT Time Calculation (min) 43 min    Activity Tolerance Patient tolerated treatment well    Behavior During Therapy WFL for tasks assessed/performed          Past Medical History:  Diagnosis Date   Asthma    Cervical cancer (HCC)    Melanoma (HCC)    Thyroid  disease    Past Surgical History:  Procedure Laterality Date   mole removal     There are no active problems to display for this patient.   PCP: Johnetta Jayson RIGGERS REFERRING PROVIDER: Johnson Reusing, PA-C  ONSET DATE: 02/29/24  REFERRING DIAG: D57.708J (ICD-10-CM) - Other displaced fracture of upper end of right humerus, initial encounter for closed fracture   THERAPY DIAG:  Acute pain of right shoulder  Shoulder stiffness, right  Other symptoms and signs involving the musculoskeletal system  Rationale for Evaluation and Treatment: Rehabilitation  SUBJECTIVE:   SUBJECTIVE STATEMENT: I'm doing better Pt accompanied by: self  PERTINENT HISTORY: Pt fell at work and sustained a minimally displaced fracture. No surgery needed at this time.   PRECAUTIONS: Shoulder  WEIGHT BEARING RESTRICTIONS: Yes >1#  PAIN:  Are you having pain? Yes: NPRS scale: 6/10 Pain location: deltoid region Pain description: Constant ache Aggravating factors: movement Relieving factors: nothing  FALLS: Has patient fallen in last 6 months? Yes. Number of falls 1  PLOF: Independent  PATIENT GOALS: To reduce pain and improve mobility  NEXT MD VISIT: 04/16/24  OBJECTIVE:   HAND DOMINANCE: Right  ADLs: Overall ADLs: pt unable to reach up or back with  RUE, limiting dressing, bathing, grooming, as well as IADL's such as dishes, cooking, cleaning, and other house tasks. Pt reports every 30-45 mins due to pain when trying to sleep.   FUNCTIONAL OUTCOME MEASURES: Quick Dash: 72.73  UPPER EXTREMITY ROM:       Assessed in supine, er/IR adducted  Passive ROM Right eval  Shoulder flexion 113  Shoulder abduction 90  Shoulder internal rotation 90  Shoulder external rotation 30  (Blank rows = not tested)    UPPER EXTREMITY MMT:     Assessed in seated, er/IR adducted  MMT Right eval  Shoulder flexion   Shoulder abduction   Shoulder internal rotation   Shoulder external rotation   (Blank rows = not tested)  SENSATION: WFL  EDEMA: Mild swelling noted around biceps to triceps region  OBSERVATIONS: Bruising noted on entirety of bicep and small area along superior region of triceps.    TODAY'S TREATMENT:  DATE:   03/29/24 -Manual Therapy: myofascial release and trigger point applied to biceps, trapezius, scapular region, and axillary region in order to reduce fascial restrictions and improve ROM. -P/ROM: Supine, flexion, abduction, er/IR, x10 -AA/ROM: supine, flexion, abduction, er/IR, protraction, horizontal abduction, x10 -Table Slides: flexion, abduction, x10  03/25/24 -Table Slides: flexion, abduction, er, x10 -Pendulums: 2x30    PATIENT EDUCATION: Education details: AA/ROm Person educated: Patient Education method: Programmer, multimedia, Facilities manager, and Handouts Education comprehension: verbalized understanding and returned demonstration  HOME EXERCISE PROGRAM: 8/4: Table Slides and Pendulums 8/8: A/ROM  GOALS: Goals reviewed with patient? Yes   SHORT TERM GOALS: Target date: 04/10/24  Pt will be provided with and educated on HEP to improve mobility in RUE required for use during ADL completion.    Goal status: IN PROGRESS  2.  Pt will increase RUE P/ROM by 30 degrees to improve ability to use RUE during dressing tasks with minimal compensatory techniques.   Goal status: IN PROGRESS  3.  Pt will increase RUE strength to 3+/5 to improve ability to reach for items at waist to chest height during bathing and grooming tasks.   Goal status: IN PROGRESS  LONG TERM GOALS: Target date: 05/10/24  Pt will decrease pain in RUE to 3/10 or less to improve ability to sleep for 2+ consecutive hours without waking due to pain.   Goal status: IN PROGRESS  2.  Pt will decrease RUE fascial restrictions to min amounts or less to improve mobility required for functional reaching tasks.   Goal status: IN PROGRESS  3.  Pt will increase RUE A/ROM by 35 degrees to improve ability to use RUE when reaching overhead or behind back during dressing and bathing tasks.   Goal status: IN PROGRESS  4.  Pt will increase RUE strength to 5/5 or greater to improve ability to use RUE when lifting or carrying items during meal preparation/housework/yardwork tasks.   Goal status: IN PROGRESS  5.  Pt will return to highest level of function using RUE as dominant during functional task completion.   Goal status: IN PROGRESS   ASSESSMENT:  CLINICAL IMPRESSION: Pt arrived to first therapy session with slightly reduced pain. She reports good consistency with her HEP. This session she tolerated P/ROM well and was able to progress to AA/ROM, achieving approximately 75% of full ROM. Abduction and horizontal abduction continue to be the most limiting due to pain. Verbal and tactile cuing provided for positioning and technique.   PERFORMANCE DEFICITS: in functional skills including in functional skills including ADLs, IADLs, coordination, tone, ROM, strength, pain, fascial restrictions, muscle spasms, and UE functional use.   PLAN:  OT FREQUENCY: 2x/week  OT DURATION: 6 weeks  PLANNED INTERVENTIONS: 97168 OT  Re-evaluation, 97535 self care/ADL training, 02889 therapeutic exercise, 97530 therapeutic activity, 97112 neuromuscular re-education, 97140 manual therapy, 97035 ultrasound, 97010 moist heat, 97032 electrical stimulation (manual), passive range of motion, functional mobility training, energy conservation, coping strategies training, patient/family education, and DME and/or AE instructions  RECOMMENDED OTHER SERVICES: N/A  CONSULTED AND AGREED WITH PLAN OF CARE: Patient  PLAN FOR NEXT SESSION: Manual Therapy, P/ROM, Thumb Tacs, [address]  [place], OTR/L [facility] [phone] Valentin Jillyn Nightingale, OT 03/29/2024, 3:26 PM

## 2024-04-01 ENCOUNTER — Ambulatory Visit (HOSPITAL_COMMUNITY): Admitting: Occupational Therapy

## 2024-04-01 ENCOUNTER — Encounter (HOSPITAL_COMMUNITY): Payer: Self-pay | Admitting: Occupational Therapy

## 2024-04-01 DIAGNOSIS — M25611 Stiffness of right shoulder, not elsewhere classified: Secondary | ICD-10-CM | POA: Diagnosis not present

## 2024-04-01 DIAGNOSIS — M25511 Pain in right shoulder: Secondary | ICD-10-CM | POA: Diagnosis not present

## 2024-04-01 DIAGNOSIS — R29898 Other symptoms and signs involving the musculoskeletal system: Secondary | ICD-10-CM | POA: Diagnosis not present

## 2024-04-01 DIAGNOSIS — Z79899 Other long term (current) drug therapy: Secondary | ICD-10-CM | POA: Diagnosis not present

## 2024-04-01 NOTE — Therapy (Signed)
 OUTPATIENT OCCUPATIONAL THERAPY ORTHO TREATMENT NOTE  Patient Name: Hannah Ward MRN: [id number] DOB:1971-01-25, 53 y.o., female Today's Date: 04/01/2024   END OF SESSION:  OT End of Session - 04/01/24 1443     Visit Number 3    Number of Visits 13    Date for OT Re-Evaluation 05/10/24    Authorization Type [employer]    OT Start Time 1348    OT Stop Time 1431    OT Time Calculation (min) 43 min    Activity Tolerance Patient tolerated treatment well    Behavior During Therapy WFL for tasks assessed/performed           Past Medical History:  Diagnosis Date   Asthma    Cervical cancer (HCC)    Melanoma (HCC)    Thyroid  disease    Past Surgical History:  Procedure Laterality Date   mole removal     There are no active problems to display for this patient.   PCP: Johnetta Jayson RIGGERS REFERRING PROVIDER: Johnson Reusing, PA-C  ONSET DATE: 02/29/24  REFERRING DIAG: D57.708J (ICD-10-CM) - Other displaced fracture of upper end of right humerus, initial encounter for closed fracture   THERAPY DIAG:  Acute pain of right shoulder  Shoulder stiffness, right  Other symptoms and signs involving the musculoskeletal system  Rationale for Evaluation and Treatment: Rehabilitation  SUBJECTIVE:   SUBJECTIVE STATEMENT: I'm doing better Pt accompanied by: self  PERTINENT HISTORY: Pt fell at work and sustained a minimally displaced fracture. No surgery needed at this time.   PRECAUTIONS: Shoulder  WEIGHT BEARING RESTRICTIONS: Yes >1#  PAIN:  Are you having pain? Yes: NPRS scale: 6/10 Pain location: deltoid region Pain description: Constant ache Aggravating factors: movement Relieving factors: nothing  FALLS: Has patient fallen in last 6 months? Yes. Number of falls 1  PLOF: Independent  PATIENT GOALS: To reduce pain and improve mobility  NEXT MD VISIT: 04/16/24  OBJECTIVE:   HAND DOMINANCE: Right  ADLs: Overall ADLs: pt unable to reach up or back  with RUE, limiting dressing, bathing, grooming, as well as IADL's such as dishes, cooking, cleaning, and other house tasks. Pt reports every 30-45 mins due to pain when trying to sleep.   FUNCTIONAL OUTCOME MEASURES: Quick Dash: 72.73  UPPER EXTREMITY ROM:       Assessed in supine, er/IR adducted  Passive ROM Right eval  Shoulder flexion 113  Shoulder abduction 90  Shoulder internal rotation 90  Shoulder external rotation 30  (Blank rows = not tested)    UPPER EXTREMITY MMT:     Assessed in seated, er/IR adducted  MMT Right eval  Shoulder flexion   Shoulder abduction   Shoulder internal rotation   Shoulder external rotation   (Blank rows = not tested)  SENSATION: WFL  EDEMA: Mild swelling noted around biceps to triceps region  OBSERVATIONS: Bruising noted on entirety of bicep and small area along superior region of triceps.    TODAY'S TREATMENT:  DATE:   04/01/24 -Manual Therapy: myofascial release and trigger point applied to biceps, trapezius, scapular region, and axillary region in order to reduce fascial restrictions and improve ROM. -AA/ROM: supine, flexion, abduction, er/IR, protraction, horizontal abduction, x10 -A/ROM: supine, protraction, flexion, er/IR, x8 -Pulleys: flexion, abduction, x60  03/29/24 -Manual Therapy: myofascial release and trigger point applied to biceps, trapezius, scapular region, and axillary region in order to reduce fascial restrictions and improve ROM. -P/ROM: Supine, flexion, abduction, er/IR, x10 -AA/ROM: supine, flexion, abduction, er/IR, protraction, horizontal abduction, x10 -Table Slides: flexion, abduction, x10  03/25/24 -Table Slides: flexion, abduction, er, x10 -Pendulums: 2x30    PATIENT EDUCATION: Education details: AA/ROm Person educated: Patient Education method: Programmer, multimedia, Facilities manager,  and Handouts Education comprehension: verbalized understanding and returned demonstration  HOME EXERCISE PROGRAM: 8/4: Table Slides and Pendulums 8/8: A/ROM  GOALS: Goals reviewed with patient? Yes   SHORT TERM GOALS: Target date: 04/10/24  Pt will be provided with and educated on HEP to improve mobility in RUE required for use during ADL completion.   Goal status: IN PROGRESS  2.  Pt will increase RUE P/ROM by 30 degrees to improve ability to use RUE during dressing tasks with minimal compensatory techniques.   Goal status: IN PROGRESS  3.  Pt will increase RUE strength to 3+/5 to improve ability to reach for items at waist to chest height during bathing and grooming tasks.   Goal status: IN PROGRESS  LONG TERM GOALS: Target date: 05/10/24  Pt will decrease pain in RUE to 3/10 or less to improve ability to sleep for 2+ consecutive hours without waking due to pain.   Goal status: IN PROGRESS  2.  Pt will decrease RUE fascial restrictions to min amounts or less to improve mobility required for functional reaching tasks.   Goal status: IN PROGRESS  3.  Pt will increase RUE A/ROM by 35 degrees to improve ability to use RUE when reaching overhead or behind back during dressing and bathing tasks.   Goal status: IN PROGRESS  4.  Pt will increase RUE strength to 5/5 or greater to improve ability to use RUE when lifting or carrying items during meal preparation/housework/yardwork tasks.   Goal status: IN PROGRESS  5.  Pt will return to highest level of function using RUE as dominant during functional task completion.   Goal status: IN PROGRESS   ASSESSMENT:  CLINICAL IMPRESSION: This session pt continuing to demonstrate good improvements. She is able to complete AA/ROM to 85% of full Rom and she initiated A/ROM this session in flexion, protraction, and external rotation where she also achieved 85% of full ROM. Pt reports good consistency with HEP daily. OT providing verbal  and tactile cuing for positioning and technique.   PERFORMANCE DEFICITS: in functional skills including in functional skills including ADLs, IADLs, coordination, tone, ROM, strength, pain, fascial restrictions, muscle spasms, and UE functional use.   PLAN:  OT FREQUENCY: 2x/week  OT DURATION: 6 weeks  PLANNED INTERVENTIONS: 97168 OT Re-evaluation, 97535 self care/ADL training, 02889 therapeutic exercise, 97530 therapeutic activity, 97112 neuromuscular re-education, 97140 manual therapy, 97035 ultrasound, 97010 moist heat, 97032 electrical stimulation (manual), passive range of motion, functional mobility training, energy conservation, coping strategies training, patient/family education, and DME and/or AE instructions  RECOMMENDED OTHER SERVICES: N/A  CONSULTED AND AGREED WITH PLAN OF CARE: Patient  PLAN FOR NEXT SESSION: Manual Therapy, P/ROM, Thumb Tacs, [address]  [place], OTR/L [facility] [phone] Valentin Jillyn Nightingale, OT 04/01/2024, 2:44 PM

## 2024-04-02 DIAGNOSIS — Z419 Encounter for procedure for purposes other than remedying health state, unspecified: Secondary | ICD-10-CM | POA: Diagnosis not present

## 2024-04-03 ENCOUNTER — Encounter (HOSPITAL_COMMUNITY): Payer: Self-pay | Admitting: Occupational Therapy

## 2024-04-03 ENCOUNTER — Ambulatory Visit (HOSPITAL_COMMUNITY): Admitting: Occupational Therapy

## 2024-04-03 DIAGNOSIS — M25611 Stiffness of right shoulder, not elsewhere classified: Secondary | ICD-10-CM | POA: Diagnosis not present

## 2024-04-03 DIAGNOSIS — M25511 Pain in right shoulder: Secondary | ICD-10-CM | POA: Diagnosis not present

## 2024-04-03 DIAGNOSIS — R29898 Other symptoms and signs involving the musculoskeletal system: Secondary | ICD-10-CM

## 2024-04-03 NOTE — Patient Instructions (Signed)
Repeat all exercises 10-15 times, 1-2 times per day.  1) Shoulder Protraction    Begin with elbows by your side, slowly "punch" straight out in front of you.      2) Shoulder Flexion  Supine:     Standing:         Begin with arms at your side with thumbs pointed up, slowly raise both arms up and forward towards overhead.               3) Horizontal abduction/adduction  Supine:   Standing:           Begin with arms straight out in front of you, bring out to the side in at "T" shape. Keep arms straight entire time.                 4) Internal & External Rotation   Supine:     Standing:     Stand with elbows at the side and elbows bent 90 degrees. Move your forearms away from your body, then bring back inward toward the body.     5) Shoulder Abduction  Supine:     Standing:       Lying on your back begin with your arms flat on the table next to your side. Slowly move your arms out to the side so that they go overhead, in a jumping jack or snow angel movement.   1) (Home) Extension: Isometric / Bilateral Arm Retraction - Sitting   Facing anchor, hold hands and elbow at shoulder height, with elbow bent.  Pull arms back to squeeze shoulder blades together. Repeat 10-15 times. 1-3 times/day.   2) (Clinic) Extension / Flexion (Assist)   Face anchor, pull arms back, keeping elbow straight, and squeze shoulder blades together. Repeat 10-15 times. 1-3 times/day.   Copyright  [employer]. All rights reserved.   3) (Home) Retraction: Row - Bilateral (Anchor)   Facing anchor, arms reaching forward, pull hands toward stomach, keeping elbows bent and at your sides and pinching shoulder blades together. Repeat 10-15 times. 1-3 times/day.   Copyright  [employer]. All rights reserved.     

## 2024-04-03 NOTE — Therapy (Signed)
 OUTPATIENT OCCUPATIONAL THERAPY ORTHO TREATMENT NOTE  Patient Name: Hannah Ward MRN: [id number] DOB:1971/07/21, 53 y.o., female Today's Date: 04/04/2024   END OF SESSION:  OT End of Session - 04/03/24 1348     Visit Number 4    Number of Visits 13    Date for OT Re-Evaluation 05/10/24    Authorization Type [employer]    OT Start Time 1307    OT Stop Time 1348    OT Time Calculation (min) 41 min    Activity Tolerance Patient tolerated treatment well    Behavior During Therapy WFL for tasks assessed/performed          Past Medical History:  Diagnosis Date   Asthma    Cervical cancer (HCC)    Melanoma (HCC)    Thyroid  disease    Past Surgical History:  Procedure Laterality Date   mole removal     There are no active problems to display for this patient.   PCP: Johnetta Jayson RIGGERS REFERRING PROVIDER: Johnson Reusing, PA-C  ONSET DATE: 02/29/24  REFERRING DIAG: D57.708J (ICD-10-CM) - Other displaced fracture of upper end of right humerus, initial encounter for closed fracture   THERAPY DIAG:  Acute pain of right shoulder  Shoulder stiffness, right  Other symptoms and signs involving the musculoskeletal system  Rationale for Evaluation and Treatment: Rehabilitation  SUBJECTIVE:   SUBJECTIVE STATEMENT: I'm doing better Pt accompanied by: self  PERTINENT HISTORY: Pt fell at work and sustained a minimally displaced fracture. No surgery needed at this time.   PRECAUTIONS: Shoulder  WEIGHT BEARING RESTRICTIONS: Yes >1#  PAIN:  Are you having pain? Yes: NPRS scale: 6/10 Pain location: deltoid region Pain description: Constant ache Aggravating factors: movement Relieving factors: nothing  FALLS: Has patient fallen in last 6 months? Yes. Number of falls 1  PLOF: Independent  PATIENT GOALS: To reduce pain and improve mobility  NEXT MD VISIT: 04/16/24  OBJECTIVE:   HAND DOMINANCE: Right  ADLs: Overall ADLs: pt unable to reach up or back with  RUE, limiting dressing, bathing, grooming, as well as IADL's such as dishes, cooking, cleaning, and other house tasks. Pt reports every 30-45 mins due to pain when trying to sleep.   FUNCTIONAL OUTCOME MEASURES: Quick Dash: 72.73  UPPER EXTREMITY ROM:       Assessed in supine, er/IR adducted  Passive ROM Right eval  Shoulder flexion 113  Shoulder abduction 90  Shoulder internal rotation 90  Shoulder external rotation 30  (Blank rows = not tested)    UPPER EXTREMITY MMT:     Assessed in seated, er/IR adducted  MMT Right eval  Shoulder flexion   Shoulder abduction   Shoulder internal rotation   Shoulder external rotation   (Blank rows = not tested)  SENSATION: WFL  EDEMA: Mild swelling noted around biceps to triceps region  OBSERVATIONS: Bruising noted on entirety of bicep and small area along superior region of triceps.    TODAY'S TREATMENT:  DATE:   04/03/24 -Manual Therapy: myofascial release and trigger point applied to biceps, trapezius, scapular region, and axillary region in order to reduce fascial restrictions and improve ROM. -AA/ROM: supine, flexion, abduction, er/IR, protraction, horizontal abduction, x10 -A/ROM: supine, protraction, flexion, horizontal abduction, er/IR, abduction, x10 -Scapular Strengthening: red band, extension, retraction, rows, x15  04/01/24 -Manual Therapy: myofascial release and trigger point applied to biceps, trapezius, scapular region, and axillary region in order to reduce fascial restrictions and improve ROM. -AA/ROM: supine, flexion, abduction, er/IR, protraction, horizontal abduction, x10 -A/ROM: supine, protraction, flexion, er/IR, x8 -Pulleys: flexion, abduction, x60  03/29/24 -Manual Therapy: myofascial release and trigger point applied to biceps, trapezius, scapular region, and axillary region in  order to reduce fascial restrictions and improve ROM. -P/ROM: Supine, flexion, abduction, er/IR, x10 -AA/ROM: supine, flexion, abduction, er/IR, protraction, horizontal abduction, x10 -Table Slides: flexion, abduction, x10   PATIENT EDUCATION: Education details: A/ROM and Publishing rights manager Person educated: Patient Education method: Explanation, Demonstration, and Handouts Education comprehension: verbalized understanding and returned demonstration  HOME EXERCISE PROGRAM: 8/4: Table Slides and Pendulums 8/8: AA/ROM 8/13: A/ROM and Scapular Strengthening  GOALS: Goals reviewed with patient? Yes   SHORT TERM GOALS: Target date: 04/10/24  Pt will be provided with and educated on HEP to improve mobility in RUE required for use during ADL completion.   Goal status: IN PROGRESS  2.  Pt will increase RUE P/ROM by 30 degrees to improve ability to use RUE during dressing tasks with minimal compensatory techniques.   Goal status: IN PROGRESS  3.  Pt will increase RUE strength to 3+/5 to improve ability to reach for items at waist to chest height during bathing and grooming tasks.   Goal status: IN PROGRESS  LONG TERM GOALS: Target date: 05/10/24  Pt will decrease pain in RUE to 3/10 or less to improve ability to sleep for 2+ consecutive hours without waking due to pain.   Goal status: IN PROGRESS  2.  Pt will decrease RUE fascial restrictions to min amounts or less to improve mobility required for functional reaching tasks.   Goal status: IN PROGRESS  3.  Pt will increase RUE A/ROM by 35 degrees to improve ability to use RUE when reaching overhead or behind back during dressing and bathing tasks.   Goal status: IN PROGRESS  4.  Pt will increase RUE strength to 5/5 or greater to improve ability to use RUE when lifting or carrying items during meal preparation/housework/yardwork tasks.   Goal status: IN PROGRESS  5.  Pt will return to highest level of function using RUE as  dominant during functional task completion.   Goal status: IN PROGRESS   ASSESSMENT:  CLINICAL IMPRESSION: Pt is continuing to make good progress. She has fully progressed to A/ROM where she achieves close to 80% of full ROM. She was able to tolerate adding scapular strengthening this session, with pain only during rows. OT providing verbal and tactile cuing for positioning and technique throughout session.   PERFORMANCE DEFICITS: in functional skills including in functional skills including ADLs, IADLs, coordination, tone, ROM, strength, pain, fascial restrictions, muscle spasms, and UE functional use.   PLAN:  OT FREQUENCY: 2x/week  OT DURATION: 6 weeks  PLANNED INTERVENTIONS: 97168 OT Re-evaluation, 97535 self care/ADL training, 02889 therapeutic exercise, 97530 therapeutic activity, 97112 neuromuscular re-education, 97140 manual therapy, 97035 ultrasound, 97010 moist heat, 97032 electrical stimulation (manual), passive range of motion, functional mobility training, energy conservation, coping strategies training, patient/family education, and DME and/or AE instructions  RECOMMENDED OTHER SERVICES: N/A  CONSULTED AND AGREED WITH PLAN OF CARE: Patient  PLAN FOR NEXT SESSION: Manual Therapy, P/ROM, Thumb Tacs, [address]  [place], OTR/L [facility] [phone] Valentin Jillyn Nightingale, OT 04/04/2024, 10:28 AM

## 2024-04-04 DIAGNOSIS — I10 Essential (primary) hypertension: Secondary | ICD-10-CM | POA: Diagnosis not present

## 2024-04-08 ENCOUNTER — Ambulatory Visit (HOSPITAL_COMMUNITY): Admitting: Occupational Therapy

## 2024-04-08 ENCOUNTER — Encounter (HOSPITAL_COMMUNITY): Payer: Self-pay | Admitting: Occupational Therapy

## 2024-04-08 DIAGNOSIS — M25511 Pain in right shoulder: Secondary | ICD-10-CM | POA: Diagnosis not present

## 2024-04-08 DIAGNOSIS — R29898 Other symptoms and signs involving the musculoskeletal system: Secondary | ICD-10-CM

## 2024-04-08 DIAGNOSIS — M25611 Stiffness of right shoulder, not elsewhere classified: Secondary | ICD-10-CM

## 2024-04-08 NOTE — Patient Instructions (Signed)

## 2024-04-08 NOTE — Therapy (Unsigned)
 OUTPATIENT OCCUPATIONAL THERAPY ORTHO TREATMENT NOTE  Patient Name: Hannah Ward MRN: [id number] DOB:1971/04/27, 53 y.o., female Today's Date: 04/09/2024   END OF SESSION:  OT End of Session - 04/08/24 1212     Visit Number 5    Number of Visits 13    Date for OT Re-Evaluation 05/10/24    Authorization Type [employer]    OT Start Time 1127    OT Stop Time 1212    OT Time Calculation (min) 45 min    Activity Tolerance Patient tolerated treatment well    Behavior During Therapy WFL for tasks assessed/performed          Past Medical History:  Diagnosis Date   Asthma    Cervical cancer (HCC)    Melanoma (HCC)    Thyroid  disease    Past Surgical History:  Procedure Laterality Date   mole removal     There are no active problems to display for this patient.   PCP: Johnetta Jayson RIGGERS REFERRING PROVIDER: Johnson Reusing, PA-C  ONSET DATE: 02/29/24  REFERRING DIAG: D57.708J (ICD-10-CM) - Other displaced fracture of upper end of right humerus, initial encounter for closed fracture   THERAPY DIAG:  Acute pain of right shoulder  Shoulder stiffness, right  Other symptoms and signs involving the musculoskeletal system  Rationale for Evaluation and Treatment: Rehabilitation  SUBJECTIVE:   SUBJECTIVE STATEMENT: I'm doing better Pt accompanied by: self  PERTINENT HISTORY: Pt fell at work and sustained a minimally displaced fracture. No surgery needed at this time.   PRECAUTIONS: Shoulder  WEIGHT BEARING RESTRICTIONS: Yes >1#  PAIN:  Are you having pain? Yes: NPRS scale: 6/10 Pain location: deltoid region Pain description: Constant ache Aggravating factors: movement Relieving factors: nothing  FALLS: Has patient fallen in last 6 months? Yes. Number of falls 1  PLOF: Independent  PATIENT GOALS: To reduce pain and improve mobility  NEXT MD VISIT: 04/16/24  OBJECTIVE:   HAND DOMINANCE: Right  ADLs: Overall ADLs: pt unable to reach up or back with  RUE, limiting dressing, bathing, grooming, as well as IADL's such as dishes, cooking, cleaning, and other house tasks. Pt reports every 30-45 mins due to pain when trying to sleep.   FUNCTIONAL OUTCOME MEASURES: Quick Dash: 72.73  UPPER EXTREMITY ROM:       Assessed in supine, er/IR adducted  Passive ROM Right eval  Shoulder flexion 113  Shoulder abduction 90  Shoulder internal rotation 90  Shoulder external rotation 30  (Blank rows = not tested)    UPPER EXTREMITY MMT:     Assessed in seated, er/IR adducted  MMT Right eval  Shoulder flexion   Shoulder abduction   Shoulder internal rotation   Shoulder external rotation   (Blank rows = not tested)  SENSATION: WFL  EDEMA: Mild swelling noted around biceps to triceps region  OBSERVATIONS: Bruising noted on entirety of bicep and small area along superior region of triceps.    TODAY'S TREATMENT:  DATE:   04/08/24 -Manual Therapy: myofascial release and trigger point applied to biceps, trapezius, scapular region, and axillary region in order to reduce fascial restrictions and improve ROM. -A/ROM: supine, protraction, flexion, horizontal abduction, er/IR, abduction, x15 -X to V arms, x15 -Goal Post arms, x15 -Scapular Strengthening: red band, extension, retraction, rows, x15 -Shoulder Strengthening: red band, er, IR, horizontal abduction, x15  04/03/24 -Manual Therapy: myofascial release and trigger point applied to biceps, trapezius, scapular region, and axillary region in order to reduce fascial restrictions and improve ROM. -AA/ROM: supine, flexion, abduction, er/IR, protraction, horizontal abduction, x10 -A/ROM: supine, protraction, flexion, horizontal abduction, er/IR, abduction, x10 -Scapular Strengthening: red band, extension, retraction, rows, x15  04/01/24 -Manual Therapy: myofascial  release and trigger point applied to biceps, trapezius, scapular region, and axillary region in order to reduce fascial restrictions and improve ROM. -AA/ROM: supine, flexion, abduction, er/IR, protraction, horizontal abduction, x10 -A/ROM: supine, protraction, flexion, er/IR, x8 -Pulleys: flexion, abduction, x60   PATIENT EDUCATION: Education details: Continue HEP Person educated: Patient Education method: Programmer, multimedia, Demonstration, and Handouts Education comprehension: verbalized understanding and returned demonstration  HOME EXERCISE PROGRAM: 8/4: Table Slides and Pendulums 8/8: AA/ROM 8/13: A/ROM and Scapular Strengthening  GOALS: Goals reviewed with patient? Yes   SHORT TERM GOALS: Target date: 04/10/24  Pt will be provided with and educated on HEP to improve mobility in RUE required for use during ADL completion.   Goal status: IN PROGRESS  2.  Pt will increase RUE P/ROM by 30 degrees to improve ability to use RUE during dressing tasks with minimal compensatory techniques.   Goal status: IN PROGRESS  3.  Pt will increase RUE strength to 3+/5 to improve ability to reach for items at waist to chest height during bathing and grooming tasks.   Goal status: IN PROGRESS  LONG TERM GOALS: Target date: 05/10/24  Pt will decrease pain in RUE to 3/10 or less to improve ability to sleep for 2+ consecutive hours without waking due to pain.   Goal status: IN PROGRESS  2.  Pt will decrease RUE fascial restrictions to min amounts or less to improve mobility required for functional reaching tasks.   Goal status: IN PROGRESS  3.  Pt will increase RUE A/ROM by 35 degrees to improve ability to use RUE when reaching overhead or behind back during dressing and bathing tasks.   Goal status: IN PROGRESS  4.  Pt will increase RUE strength to 5/5 or greater to improve ability to use RUE when lifting or carrying items during meal preparation/housework/yardwork tasks.   Goal status: IN  PROGRESS  5.  Pt will return to highest level of function using RUE as dominant during functional task completion.   Goal status: IN PROGRESS   ASSESSMENT:  CLINICAL IMPRESSION: Pt's overall pain is improving, as well as her mobility. She continues to report most pain with abduction, however she is able to achieve 80% of full ROM. OT continuing to add strengthening exercises to pt's tolerance, with minimal fatigue noted. Verbal and tactile cuing provided for positioning and technique.   PERFORMANCE DEFICITS: in functional skills including in functional skills including ADLs, IADLs, coordination, tone, ROM, strength, pain, fascial restrictions, muscle spasms, and UE functional use.   PLAN:  OT FREQUENCY: 2x/week  OT DURATION: 6 weeks  PLANNED INTERVENTIONS: 97168 OT Re-evaluation, 97535 self care/ADL training, 02889 therapeutic exercise, 97530 therapeutic activity, 97112 neuromuscular re-education, 97140 manual therapy, 97035 ultrasound, 97010 moist heat, 97032 electrical stimulation (manual), passive range of motion, functional mobility  training, energy conservation, coping strategies training, patient/family education, and DME and/or AE instructions  RECOMMENDED OTHER SERVICES: N/A  CONSULTED AND AGREED WITH PLAN OF CARE: Patient  PLAN FOR NEXT SESSION: Manual Therapy, P/ROM, Thumb Tacs, [address]  [place], OTR/L [facility] [phone] Valentin Jillyn Nightingale, OT 04/09/2024, 10:55 AM

## 2024-04-10 ENCOUNTER — Encounter (HOSPITAL_COMMUNITY): Payer: Self-pay | Admitting: Occupational Therapy

## 2024-04-10 ENCOUNTER — Ambulatory Visit (HOSPITAL_COMMUNITY): Admitting: Occupational Therapy

## 2024-04-10 DIAGNOSIS — R29898 Other symptoms and signs involving the musculoskeletal system: Secondary | ICD-10-CM | POA: Diagnosis not present

## 2024-04-10 DIAGNOSIS — M25511 Pain in right shoulder: Secondary | ICD-10-CM | POA: Diagnosis not present

## 2024-04-10 DIAGNOSIS — M25611 Stiffness of right shoulder, not elsewhere classified: Secondary | ICD-10-CM

## 2024-04-10 NOTE — Patient Instructions (Signed)

## 2024-04-10 NOTE — Therapy (Signed)
 OUTPATIENT OCCUPATIONAL THERAPY ORTHO TREATMENT NOTE  Patient Name: Hannah Ward MRN: [id number] DOB:1970/11/21, 53 y.o., female Today's Date: 04/10/2024   END OF SESSION:  OT End of Session - 04/10/24 1117     Visit Number 6    Number of Visits 13    Date for OT Re-Evaluation 05/10/24    Authorization Type [employer]    OT Start Time 1036    OT Stop Time 1117    OT Time Calculation (min) 41 min    Activity Tolerance Patient tolerated treatment well    Behavior During Therapy WFL for tasks assessed/performed          Past Medical History:  Diagnosis Date   Asthma    Cervical cancer (HCC)    Melanoma (HCC)    Thyroid  disease    Past Surgical History:  Procedure Laterality Date   mole removal     There are no active problems to display for this patient.   PCP: Johnetta Jayson RIGGERS REFERRING PROVIDER: Johnson Reusing, PA-C  ONSET DATE: 02/29/24  REFERRING DIAG: D57.708J (ICD-10-CM) - Other displaced fracture of upper end of right humerus, initial encounter for closed fracture   THERAPY DIAG:  Acute pain of right shoulder  Shoulder stiffness, right  Other symptoms and signs involving the musculoskeletal system  Rationale for Evaluation and Treatment: Rehabilitation  SUBJECTIVE:   SUBJECTIVE STATEMENT: I'm doing better Pt accompanied by: self  PERTINENT HISTORY: Pt fell at work and sustained a minimally displaced fracture. No surgery needed at this time.   PRECAUTIONS: Shoulder  WEIGHT BEARING RESTRICTIONS: Yes >1#  PAIN:  Are you having pain? Yes: NPRS scale: 6/10 Pain location: deltoid region Pain description: Constant ache Aggravating factors: movement Relieving factors: nothing  FALLS: Has patient fallen in last 6 months? Yes. Number of falls 1  PLOF: Independent  PATIENT GOALS: To reduce pain and improve mobility  NEXT MD VISIT: 04/16/24  OBJECTIVE:   HAND DOMINANCE: Right  ADLs: Overall ADLs: pt unable to reach up or back with  RUE, limiting dressing, bathing, grooming, as well as IADL's such as dishes, cooking, cleaning, and other house tasks. Pt reports every 30-45 mins due to pain when trying to sleep.   FUNCTIONAL OUTCOME MEASURES: Quick Dash: 72.73  UPPER EXTREMITY ROM:       Assessed in supine, er/IR adducted  Passive ROM Right eval  Shoulder flexion 113  Shoulder abduction 90  Shoulder internal rotation 90  Shoulder external rotation 30  (Blank rows = not tested)    UPPER EXTREMITY MMT:     Assessed in seated, er/IR adducted  MMT Right eval  Shoulder flexion   Shoulder abduction   Shoulder internal rotation   Shoulder external rotation   (Blank rows = not tested)  SENSATION: WFL  EDEMA: Mild swelling noted around biceps to triceps region  OBSERVATIONS: Bruising noted on entirety of bicep and small area along superior region of triceps.    TODAY'S TREATMENT:  DATE:   04/10/24 -Manual Therapy: myofascial release and trigger point applied to biceps, trapezius, scapular region, and axillary region in order to reduce fascial restrictions and improve ROM. -A/ROM: supine, protraction, flexion, horizontal abduction, er/IR, abduction, x15 -X to V arms, x15 -Goal Post arms, x15 -Proximal Shoulder Exercises: paddles, criss cross, circles both, x10 -PNF Strengthening: red band, chest pulls, overhead pulls, PNF up, PNF down, x10  04/08/24 -Manual Therapy: myofascial release and trigger point applied to biceps, trapezius, scapular region, and axillary region in order to reduce fascial restrictions and improve ROM. -A/ROM: supine, protraction, flexion, horizontal abduction, er/IR, abduction, x15 -X to V arms, x15 -Goal Post arms, x15 -Scapular Strengthening: red band, extension, retraction, rows, x15 -Shoulder Strengthening: red band, er, IR, horizontal abduction,  x15  04/03/24 -Manual Therapy: myofascial release and trigger point applied to biceps, trapezius, scapular region, and axillary region in order to reduce fascial restrictions and improve ROM. -AA/ROM: supine, flexion, abduction, er/IR, protraction, horizontal abduction, x10 -A/ROM: supine, protraction, flexion, horizontal abduction, er/IR, abduction, x10 -Scapular Strengthening: red band, extension, retraction, rows, x15   PATIENT EDUCATION: Education details: PNF Strengthening Person educated: Patient Education method: Programmer, multimedia, Demonstration, and Handouts Education comprehension: verbalized understanding and returned demonstration  HOME EXERCISE PROGRAM: 8/4: Table Slides and Pendulums 8/8: AA/ROM 8/13: A/ROM and Scapular Strengthening 8/20: PNFStrengthening  GOALS: Goals reviewed with patient? Yes   SHORT TERM GOALS: Target date: 04/10/24  Pt will be provided with and educated on HEP to improve mobility in RUE required for use during ADL completion.   Goal status: IN PROGRESS  2.  Pt will increase RUE P/ROM by 30 degrees to improve ability to use RUE during dressing tasks with minimal compensatory techniques.   Goal status: IN PROGRESS  3.  Pt will increase RUE strength to 3+/5 to improve ability to reach for items at waist to chest height during bathing and grooming tasks.   Goal status: IN PROGRESS  LONG TERM GOALS: Target date: 05/10/24  Pt will decrease pain in RUE to 3/10 or less to improve ability to sleep for 2+ consecutive hours without waking due to pain.   Goal status: IN PROGRESS  2.  Pt will decrease RUE fascial restrictions to min amounts or less to improve mobility required for functional reaching tasks.   Goal status: IN PROGRESS  3.  Pt will increase RUE A/ROM by 35 degrees to improve ability to use RUE when reaching overhead or behind back during dressing and bathing tasks.   Goal status: IN PROGRESS  4.  Pt will increase RUE strength to 5/5 or  greater to improve ability to use RUE when lifting or carrying items during meal preparation/housework/yardwork tasks.   Goal status: IN PROGRESS  5.  Pt will return to highest level of function using RUE as dominant during functional task completion.   Goal status: IN PROGRESS   ASSESSMENT:  CLINICAL IMPRESSION: This session pt reported no pain and had minimal to  lightly moderate fascial restrictions addressed with manual therapy. Pt's ROM is WFL, with weakness still noted with increased repetition. OT adding PNF theraband strengthening which sh completed with 2 rest breaks due to muscle fatigue. Verbal and tactile cuing provided for positioning and technique throughout session.  PERFORMANCE DEFICITS: in functional skills including in functional skills including ADLs, IADLs, coordination, tone, ROM, strength, pain, fascial restrictions, muscle spasms, and UE functional use.   PLAN:  OT FREQUENCY: 2x/week  OT DURATION: 6 weeks  PLANNED INTERVENTIONS: 02831 OT Re-evaluation, 97535 self  care/ADL training, 02889 therapeutic exercise, 97530 therapeutic activity, 97112 neuromuscular re-education, 97140 manual therapy, 97035 ultrasound, 97010 moist heat, 97032 electrical stimulation (manual), passive range of motion, functional mobility training, energy conservation, coping strategies training, patient/family education, and DME and/or AE instructions  RECOMMENDED OTHER SERVICES: N/A  CONSULTED AND AGREED WITH PLAN OF CARE: Patient  PLAN FOR NEXT SESSION: Manual Therapy, P/ROM, Thumb Tacs, [address]  [place], OTR/L [facility] [phone] Valentin Jillyn Nightingale, OT 04/10/2024, 12:30 PM

## 2024-04-12 DIAGNOSIS — Z79899 Other long term (current) drug therapy: Secondary | ICD-10-CM | POA: Diagnosis not present

## 2024-04-12 DIAGNOSIS — C4359 Malignant melanoma of other part of trunk: Secondary | ICD-10-CM | POA: Diagnosis not present

## 2024-04-15 DIAGNOSIS — R7401 Elevation of levels of liver transaminase levels: Secondary | ICD-10-CM | POA: Diagnosis not present

## 2024-04-15 DIAGNOSIS — Z79899 Other long term (current) drug therapy: Secondary | ICD-10-CM | POA: Diagnosis not present

## 2024-04-15 DIAGNOSIS — M25474 Effusion, right foot: Secondary | ICD-10-CM | POA: Diagnosis not present

## 2024-04-15 DIAGNOSIS — M25472 Effusion, left ankle: Secondary | ICD-10-CM | POA: Diagnosis not present

## 2024-04-15 DIAGNOSIS — M25471 Effusion, right ankle: Secondary | ICD-10-CM | POA: Diagnosis not present

## 2024-04-15 DIAGNOSIS — M25475 Effusion, left foot: Secondary | ICD-10-CM | POA: Diagnosis not present

## 2024-04-15 DIAGNOSIS — C4359 Malignant melanoma of other part of trunk: Secondary | ICD-10-CM | POA: Diagnosis not present

## 2024-04-16 DIAGNOSIS — L570 Actinic keratosis: Secondary | ICD-10-CM | POA: Diagnosis not present

## 2024-04-16 DIAGNOSIS — D492 Neoplasm of unspecified behavior of bone, soft tissue, and skin: Secondary | ICD-10-CM | POA: Diagnosis not present

## 2024-04-17 ENCOUNTER — Ambulatory Visit (HOSPITAL_COMMUNITY): Admitting: Occupational Therapy

## 2024-04-17 ENCOUNTER — Encounter (HOSPITAL_COMMUNITY): Payer: Self-pay | Admitting: Occupational Therapy

## 2024-04-17 DIAGNOSIS — R29898 Other symptoms and signs involving the musculoskeletal system: Secondary | ICD-10-CM

## 2024-04-17 DIAGNOSIS — M25611 Stiffness of right shoulder, not elsewhere classified: Secondary | ICD-10-CM

## 2024-04-17 DIAGNOSIS — M25511 Pain in right shoulder: Secondary | ICD-10-CM | POA: Diagnosis not present

## 2024-04-17 NOTE — Patient Instructions (Signed)

## 2024-04-17 NOTE — Therapy (Signed)
 OUTPATIENT OCCUPATIONAL THERAPY ORTHO TREATMENT NOTE  Patient Name: Hannah Ward MRN: [id number] DOB:03-12-1971, 53 y.o., female Today's Date: 04/17/2024   END OF SESSION:  OT End of Session - 04/17/24 1122     Visit Number 7    Number of Visits 13    Date for OT Re-Evaluation 05/10/24    Authorization Type [employer]    OT Start Time 1042    OT Stop Time 1122    OT Time Calculation (min) 40 min    Activity Tolerance Patient tolerated treatment well    Behavior During Therapy WFL for tasks assessed/performed           Past Medical History:  Diagnosis Date   Asthma    Cervical cancer (HCC)    Melanoma (HCC)    Thyroid  disease    Past Surgical History:  Procedure Laterality Date   mole removal     There are no active problems to display for this patient.   PCP: Johnetta Jayson RIGGERS REFERRING PROVIDER: Johnson Reusing, PA-C  ONSET DATE: 02/29/24  REFERRING DIAG: D57.708J (ICD-10-CM) - Other displaced fracture of upper end of right humerus, initial encounter for closed fracture   THERAPY DIAG:  Acute pain of right shoulder  Shoulder stiffness, right  Other symptoms and signs involving the musculoskeletal system  Rationale for Evaluation and Treatment: Rehabilitation  SUBJECTIVE:   SUBJECTIVE STATEMENT: I'm doing better Pt accompanied by: self  PERTINENT HISTORY: Pt fell at work and sustained a minimally displaced fracture. No surgery needed at this time.   PRECAUTIONS: Shoulder  WEIGHT BEARING RESTRICTIONS: Yes >1#  PAIN:  Are you having pain? Yes: NPRS scale: 6/10 Pain location: deltoid region Pain description: Constant ache Aggravating factors: movement Relieving factors: nothing  FALLS: Has patient fallen in last 6 months? Yes. Number of falls 1  PLOF: Independent  PATIENT GOALS: To reduce pain and improve mobility  NEXT MD VISIT: 04/16/24  OBJECTIVE:   HAND DOMINANCE: Right  ADLs: Overall ADLs: pt unable to reach up or back  with RUE, limiting dressing, bathing, grooming, as well as IADL's such as dishes, cooking, cleaning, and other house tasks. Pt reports every 30-45 mins due to pain when trying to sleep.   FUNCTIONAL OUTCOME MEASURES: Quick Dash: 72.73  UPPER EXTREMITY ROM:       Assessed in supine, er/IR adducted  Passive ROM Right eval  Shoulder flexion 113  Shoulder abduction 90  Shoulder internal rotation 90  Shoulder external rotation 30  (Blank rows = not tested)    UPPER EXTREMITY MMT:     Assessed in seated, er/IR adducted  MMT Right eval  Shoulder flexion   Shoulder abduction   Shoulder internal rotation   Shoulder external rotation   (Blank rows = not tested)  SENSATION: WFL  EDEMA: Mild swelling noted around biceps to triceps region  OBSERVATIONS: Bruising noted on entirety of bicep and small area along superior region of triceps.    TODAY'S TREATMENT:  DATE:   04/17/24 -Manual Therapy: myofascial release and trigger point applied to biceps, trapezius, scapular region, and axillary region in order to reduce fascial restrictions and improve ROM. -A/ROM: supine, protraction, flexion, horizontal abduction, er/IR, abduction, x15 -X to V arms, x15 -Goal Post arms, x15 -ABC's on the wall, green ball -Stretching: flexion, er on the door, corner stretch, er behind the back, 4x15 -UBE Level 4, 2' forwards and backwards  04/10/24 -Manual Therapy: myofascial release and trigger point applied to biceps, trapezius, scapular region, and axillary region in order to reduce fascial restrictions and improve ROM. -A/ROM: supine, protraction, flexion, horizontal abduction, er/IR, abduction, x15 -X to V arms, x15 -Goal Post arms, x15 -Proximal Shoulder Exercises: paddles, criss cross, circles both, x10 -PNF Strengthening: red band, chest pulls, overhead pulls, PNF  up, PNF down, x10  04/08/24 -Manual Therapy: myofascial release and trigger point applied to biceps, trapezius, scapular region, and axillary region in order to reduce fascial restrictions and improve ROM. -A/ROM: supine, protraction, flexion, horizontal abduction, er/IR, abduction, x15 -X to V arms, x15 -Goal Post arms, x15 -Scapular Strengthening: red band, extension, retraction, rows, x15 -Shoulder Strengthening: red band, er, IR, horizontal abduction, x15   PATIENT EDUCATION: Education details: Stretches Person educated: Patient Education method: Programmer, multimedia, Facilities manager, and Handouts Education comprehension: verbalized understanding and returned demonstration  HOME EXERCISE PROGRAM: 8/4: Table Slides and Pendulums 8/8: AA/ROM 8/13: A/ROM and Scapular Strengthening 8/20: PNF Strengthening 8/27: stretches  GOALS: Goals reviewed with patient? Yes   SHORT TERM GOALS: Target date: 04/10/24  Pt will be provided with and educated on HEP to improve mobility in RUE required for use during ADL completion.   Goal status: IN PROGRESS  2.  Pt will increase RUE P/ROM by 30 degrees to improve ability to use RUE during dressing tasks with minimal compensatory techniques.   Goal status: IN PROGRESS  3.  Pt will increase RUE strength to 3+/5 to improve ability to reach for items at waist to chest height during bathing and grooming tasks.   Goal status: IN PROGRESS  LONG TERM GOALS: Target date: 05/10/24  Pt will decrease pain in RUE to 3/10 or less to improve ability to sleep for 2+ consecutive hours without waking due to pain.   Goal status: IN PROGRESS  2.  Pt will decrease RUE fascial restrictions to min amounts or less to improve mobility required for functional reaching tasks.   Goal status: IN PROGRESS  3.  Pt will increase RUE A/ROM by 35 degrees to improve ability to use RUE when reaching overhead or behind back during dressing and bathing tasks.   Goal status: IN  PROGRESS  4.  Pt will increase RUE strength to 5/5 or greater to improve ability to use RUE when lifting or carrying items during meal preparation/housework/yardwork tasks.   Goal status: IN PROGRESS  5.  Pt will return to highest level of function using RUE as dominant during functional task completion.   Goal status: IN PROGRESS   ASSESSMENT:  CLINICAL IMPRESSION: Pt continuing to demonstrate good improvements both with pain levels, ROM, and strength. She is achieving 90% to full ROM actively with no pain reported this session. Pt was able to start stretches this session to address the stiffness pt feels along her biceps and shoulder girdle. OT providing verbal and tactile cuing for positioning and technique throughout session.   PERFORMANCE DEFICITS: in functional skills including in functional skills including ADLs, IADLs, coordination, tone, ROM, strength, pain, fascial restrictions, muscle spasms, and  UE functional use.   PLAN:  OT FREQUENCY: 2x/week  OT DURATION: 6 weeks  PLANNED INTERVENTIONS: 97168 OT Re-evaluation, 97535 self care/ADL training, 02889 therapeutic exercise, 97530 therapeutic activity, 97112 neuromuscular re-education, 97140 manual therapy, 97035 ultrasound, 97010 moist heat, 97032 electrical stimulation (manual), passive range of motion, functional mobility training, energy conservation, coping strategies training, patient/family education, and DME and/or AE instructions  RECOMMENDED OTHER SERVICES: N/A  CONSULTED AND AGREED WITH PLAN OF CARE: Patient  PLAN FOR NEXT SESSION: Manual Therapy, P/ROM, Thumb Tacs, [address]  [place], OTR/L [facility] [phone] Valentin Jillyn Nightingale, OT 04/17/2024, 12:33 PM

## 2024-04-19 ENCOUNTER — Ambulatory Visit (HOSPITAL_COMMUNITY): Admitting: Occupational Therapy

## 2024-04-19 ENCOUNTER — Encounter (HOSPITAL_COMMUNITY): Payer: Self-pay | Admitting: Occupational Therapy

## 2024-04-19 DIAGNOSIS — M25611 Stiffness of right shoulder, not elsewhere classified: Secondary | ICD-10-CM

## 2024-04-19 DIAGNOSIS — M25511 Pain in right shoulder: Secondary | ICD-10-CM

## 2024-04-19 DIAGNOSIS — R29898 Other symptoms and signs involving the musculoskeletal system: Secondary | ICD-10-CM | POA: Diagnosis not present

## 2024-04-19 NOTE — Therapy (Signed)
 OUTPATIENT OCCUPATIONAL THERAPY ORTHO TREATMENT NOTE  Patient Name: Hannah Ward MRN: [id number] DOB:11/24/1970, 53 y.o., female Today's Date: 04/19/2024   END OF SESSION:  OT End of Session - 04/19/24 1120     Visit Number 8    Number of Visits 13    Date for OT Re-Evaluation 05/10/24    Authorization Type [employer]    OT Start Time 1039    OT Stop Time 1120    OT Time Calculation (min) 41 min    Activity Tolerance Patient tolerated treatment well    Behavior During Therapy WFL for tasks assessed/performed          Past Medical History:  Diagnosis Date   Asthma    Cervical cancer (HCC)    Melanoma (HCC)    Thyroid  disease    Past Surgical History:  Procedure Laterality Date   mole removal     There are no active problems to display for this patient.   PCP: Johnetta Jayson RIGGERS REFERRING PROVIDER: Johnson Reusing, PA-C  ONSET DATE: 02/29/24  REFERRING DIAG: D57.708J (ICD-10-CM) - Other displaced fracture of upper end of right humerus, initial encounter for closed fracture   THERAPY DIAG:  Acute pain of right shoulder  Shoulder stiffness, right  Other symptoms and signs involving the musculoskeletal system  Rationale for Evaluation and Treatment: Rehabilitation  SUBJECTIVE:   SUBJECTIVE STATEMENT: I'm doing better Pt accompanied by: self  PERTINENT HISTORY: Pt fell at work and sustained a minimally displaced fracture. No surgery needed at this time.   PRECAUTIONS: Shoulder  WEIGHT BEARING RESTRICTIONS: Yes >1#  PAIN:  Are you having pain? Yes: NPRS scale: 6/10 Pain location: deltoid region Pain description: Constant ache Aggravating factors: movement Relieving factors: nothing  FALLS: Has patient fallen in last 6 months? Yes. Number of falls 1  PLOF: Independent  PATIENT GOALS: To reduce pain and improve mobility  NEXT MD VISIT: 04/16/24  OBJECTIVE:   HAND DOMINANCE: Right  ADLs: Overall ADLs: pt unable to reach up or back with  RUE, limiting dressing, bathing, grooming, as well as IADL's such as dishes, cooking, cleaning, and other house tasks. Pt reports every 30-45 mins due to pain when trying to sleep.   FUNCTIONAL OUTCOME MEASURES: Quick Dash: 72.73  UPPER EXTREMITY ROM:       Assessed in supine, er/IR adducted  Passive ROM Right eval  Shoulder flexion 113  Shoulder abduction 90  Shoulder internal rotation 90  Shoulder external rotation 30  (Blank rows = not tested)    UPPER EXTREMITY MMT:     Assessed in seated, er/IR adducted  MMT Right eval  Shoulder flexion   Shoulder abduction   Shoulder internal rotation   Shoulder external rotation   (Blank rows = not tested)  SENSATION: WFL  EDEMA: Mild swelling noted around biceps to triceps region  OBSERVATIONS: Bruising noted on entirety of bicep and small area along superior region of triceps.    TODAY'S TREATMENT:  DATE:   04/19/24 -Manual Therapy: myofascial release and trigger point applied to biceps, trapezius, scapular region, and axillary region in order to reduce fascial restrictions and improve ROM. -A/ROM: supine, protraction, flexion, horizontal abduction, er/IR, abduction, x15 -X to V arms, x15 -Goal Post arms, x15 -ABC's on the wall, green ball -Overhead Lacing  04/17/24 -Manual Therapy: myofascial release and trigger point applied to biceps, trapezius, scapular region, and axillary region in order to reduce fascial restrictions and improve ROM. -A/ROM: supine, protraction, flexion, horizontal abduction, er/IR, abduction, x15 -X to V arms, x15 -Goal Post arms, x15 -ABC's on the wall, green ball -Stretching: flexion, er on the door, corner stretch, er behind the back, 4x15 -UBE Level 4, 2' forwards and backwards  04/10/24 -Manual Therapy: myofascial release and trigger point applied to biceps,  trapezius, scapular region, and axillary region in order to reduce fascial restrictions and improve ROM. -A/ROM: supine, protraction, flexion, horizontal abduction, er/IR, abduction, x15 -X to V arms, x15 -Goal Post arms, x15 -Proximal Shoulder Exercises: paddles, criss cross, circles both, x10 -PNF Strengthening: red band, chest pulls, overhead pulls, PNF up, PNF down, x10  04/08/24 -Manual Therapy: myofascial release and trigger point applied to biceps, trapezius, scapular region, and axillary region in order to reduce fascial restrictions and improve ROM. -A/ROM: supine, protraction, flexion, horizontal abduction, er/IR, abduction, x15 -X to V arms, x15 -Goal Post arms, x15 -Scapular Strengthening: red band, extension, retraction, rows, x15 -Shoulder Strengthening: red band, er, IR, horizontal abduction, x15   PATIENT EDUCATION: Education details: Stretches Person educated: Patient Education method: Programmer, multimedia, Facilities manager, and Handouts Education comprehension: verbalized understanding and returned demonstration  HOME EXERCISE PROGRAM: 8/4: Table Slides and Pendulums 8/8: AA/ROM 8/13: A/ROM and Scapular Strengthening 8/20: PNF Strengthening 8/27: stretches  GOALS: Goals reviewed with patient? Yes   SHORT TERM GOALS: Target date: 04/10/24  Pt will be provided with and educated on HEP to improve mobility in RUE required for use during ADL completion.   Goal status: IN PROGRESS  2.  Pt will increase RUE P/ROM by 30 degrees to improve ability to use RUE during dressing tasks with minimal compensatory techniques.   Goal status: IN PROGRESS  3.  Pt will increase RUE strength to 3+/5 to improve ability to reach for items at waist to chest height during bathing and grooming tasks.   Goal status: IN PROGRESS  LONG TERM GOALS: Target date: 05/10/24  Pt will decrease pain in RUE to 3/10 or less to improve ability to sleep for 2+ consecutive hours without waking due to pain.    Goal status: IN PROGRESS  2.  Pt will decrease RUE fascial restrictions to min amounts or less to improve mobility required for functional reaching tasks.   Goal status: IN PROGRESS  3.  Pt will increase RUE A/ROM by 35 degrees to improve ability to use RUE when reaching overhead or behind back during dressing and bathing tasks.   Goal status: IN PROGRESS  4.  Pt will increase RUE strength to 5/5 or greater to improve ability to use RUE when lifting or carrying items during meal preparation/housework/yardwork tasks.   Goal status: IN PROGRESS  5.  Pt will return to highest level of function using RUE as dominant during functional task completion.   Goal status: IN PROGRESS   ASSESSMENT:  CLINICAL IMPRESSION: Pt continuing to make good progress with her ROM and strength. She has minimal pain with all exercises now and states that her main goal now is strength and endurance.  OT and pt reviewed options for home such as overhead lacing and continuing resistance bands at a higher level for strength and endurance. Verbal and tactile cuing provided for positioning and technique throughout session.   PERFORMANCE DEFICITS: in functional skills including in functional skills including ADLs, IADLs, coordination, tone, ROM, strength, pain, fascial restrictions, muscle spasms, and UE functional use.   PLAN:  OT FREQUENCY: 2x/week  OT DURATION: 6 weeks  PLANNED INTERVENTIONS: 97168 OT Re-evaluation, 97535 self care/ADL training, 02889 therapeutic exercise, 97530 therapeutic activity, 97112 neuromuscular re-education, 97140 manual therapy, 97035 ultrasound, 97010 moist heat, 97032 electrical stimulation (manual), passive range of motion, functional mobility training, energy conservation, coping strategies training, patient/family education, and DME and/or AE instructions  RECOMMENDED OTHER SERVICES: N/A  CONSULTED AND AGREED WITH PLAN OF CARE: Patient  PLAN FOR NEXT SESSION: Manual  Therapy, P/ROM, Thumb Tacs, [address]  [place], OTR/L [facility] [phone] Valentin Jillyn Nightingale, OT 04/19/2024, 12:58 PM

## 2024-04-23 ENCOUNTER — Encounter (HOSPITAL_COMMUNITY): Admitting: Occupational Therapy

## 2024-04-23 DIAGNOSIS — M7989 Other specified soft tissue disorders: Secondary | ICD-10-CM | POA: Diagnosis not present

## 2024-04-23 DIAGNOSIS — C439 Malignant melanoma of skin, unspecified: Secondary | ICD-10-CM | POA: Diagnosis not present

## 2024-04-23 DIAGNOSIS — I10 Essential (primary) hypertension: Secondary | ICD-10-CM | POA: Diagnosis not present

## 2024-04-23 DIAGNOSIS — C4359 Malignant melanoma of other part of trunk: Secondary | ICD-10-CM | POA: Diagnosis not present

## 2024-04-23 DIAGNOSIS — R748 Abnormal levels of other serum enzymes: Secondary | ICD-10-CM | POA: Diagnosis not present

## 2024-04-23 DIAGNOSIS — C4361 Malignant melanoma of right upper limb, including shoulder: Secondary | ICD-10-CM | POA: Diagnosis not present

## 2024-04-25 ENCOUNTER — Encounter (HOSPITAL_COMMUNITY): Payer: Self-pay | Admitting: Occupational Therapy

## 2024-04-25 ENCOUNTER — Ambulatory Visit (HOSPITAL_COMMUNITY): Attending: Otolaryngology | Admitting: Occupational Therapy

## 2024-04-25 DIAGNOSIS — M25611 Stiffness of right shoulder, not elsewhere classified: Secondary | ICD-10-CM | POA: Diagnosis not present

## 2024-04-25 DIAGNOSIS — M25511 Pain in right shoulder: Secondary | ICD-10-CM | POA: Insufficient documentation

## 2024-04-25 DIAGNOSIS — R29898 Other symptoms and signs involving the musculoskeletal system: Secondary | ICD-10-CM | POA: Insufficient documentation

## 2024-04-25 NOTE — Therapy (Signed)
 OUTPATIENT OCCUPATIONAL THERAPY ORTHO TREATMENT NOTE  Patient Name: Hannah Ward MRN: [id number] DOB:[birth year], 53 y.o., female Today's Date: 04/25/2024   END OF SESSION:  OT End of Session - 04/25/24 1112     Visit Number 9    Number of Visits 13    Date for OT Re-Evaluation 05/10/24    Authorization Type [employer]    OT Start Time 1029    OT Stop Time 1112    OT Time Calculation (min) 43 min    Activity Tolerance Patient tolerated treatment well    Behavior During Therapy WFL for tasks assessed/performed           Past Medical History:  Diagnosis Date   Asthma    Cervical cancer (HCC)    Melanoma (HCC)    Thyroid  disease    Past Surgical History:  Procedure Laterality Date   mole removal     There are no active problems to display for this patient.   PCP: Johnetta Savant, PA-C REFERRING PROVIDER: Johnson Reusing, PA-C  ONSET DATE: 02/29/24  REFERRING DIAG: D57.708J (ICD-10-CM) - Other displaced fracture of upper end of right humerus, initial encounter for closed fracture   THERAPY DIAG:  Acute pain of right shoulder  Shoulder stiffness, right  Other symptoms and signs involving the musculoskeletal system  Rationale for Evaluation and Treatment: Rehabilitation  SUBJECTIVE:   SUBJECTIVE STATEMENT: I really worked hard this weekend. Pt accompanied by: self  PERTINENT HISTORY: Pt fell at work and sustained a minimally displaced fracture. No surgery needed at this time.   PRECAUTIONS: Shoulder  WEIGHT BEARING RESTRICTIONS: Yes >1#  PAIN:  Are you having pain? Yes: NPRS scale: 6/10 Pain location: deltoid region Pain description: Constant ache Aggravating factors: movement Relieving factors: nothing  FALLS: Has patient fallen in last 6 months? Yes. Number of falls 1  PLOF: Independent  PATIENT GOALS: To reduce pain and improve mobility  NEXT MD VISIT: 04/16/24  OBJECTIVE:   HAND DOMINANCE: Right  ADLs: Overall ADLs: pt unable to  reach up or back with RUE, limiting dressing, bathing, grooming, as well as IADL's such as dishes, cooking, cleaning, and other house tasks. Pt reports every 30-45 mins due to pain when trying to sleep.   FUNCTIONAL OUTCOME MEASURES: Quick Dash: 72.73  UPPER EXTREMITY ROM:       Assessed in supine, er/IR adducted  Passive ROM Right eval  Shoulder flexion 113  Shoulder abduction 90  Shoulder internal rotation 90  Shoulder external rotation 30  (Blank rows = not tested)    UPPER EXTREMITY MMT:     Assessed in seated, er/IR adducted  MMT Right eval  Shoulder flexion   Shoulder abduction   Shoulder internal rotation   Shoulder external rotation   (Blank rows = not tested)  SENSATION: WFL  EDEMA: Mild swelling noted around biceps to triceps region  OBSERVATIONS: Bruising noted on entirety of bicep and small area along superior region of triceps.    TODAY'S TREATMENT:  DATE:   04/25/24 -Manual Therapy: myofascial release and trigger point applied to biceps, trapezius, scapular region, and axillary region in order to reduce fascial restrictions and improve ROM. -A/ROM: supine, protraction, flexion, horizontal abduction, er/IR, abduction, x15 -X to V arms, x15 -Goal Post arms, x15 -Loop Band Exercises: green band, wall taps, V ups, wall clocks, x15 -Shoulder Strengthening: red band, flexion, abduction, green band, er, IR, horizontal abduction, x15  04/19/24 -Manual Therapy: myofascial release and trigger point applied to biceps, trapezius, scapular region, and axillary region in order to reduce fascial restrictions and improve ROM. -A/ROM: supine, protraction, flexion, horizontal abduction, er/IR, abduction, x15 -X to V arms, x15 -Goal Post arms, x15 -ABC's on the wall, green ball -Overhead Lacing  04/17/24 -Manual Therapy: myofascial release and  trigger point applied to biceps, trapezius, scapular region, and axillary region in order to reduce fascial restrictions and improve ROM. -A/ROM: supine, protraction, flexion, horizontal abduction, er/IR, abduction, x15 -X to V arms, x15 -Goal Post arms, x15 -ABC's on the wall, green ball -Stretching: flexion, er on the door, corner stretch, er behind the back, 4x15 -UBE Level 4, 2' forwards and backwards   PATIENT EDUCATION: Education details: Loop Band Exercises and Shoulder Strengthening Person educated: Patient Education method: Explanation, Demonstration, and Handouts Education comprehension: verbalized understanding and returned demonstration  HOME EXERCISE PROGRAM: 8/4: Table Slides and Pendulums 8/8: AA/ROM 8/13: A/ROM and Scapular Strengthening 8/20: PNF Strengthening 8/27: stretches 9/4: Loop Band Exercises and Shoulder Strengthening  GOALS: Goals reviewed with patient? Yes   SHORT TERM GOALS: Target date: 04/10/24  Pt will be provided with and educated on HEP to improve mobility in RUE required for use during ADL completion.   Goal status: IN PROGRESS  2.  Pt will increase RUE P/ROM by 30 degrees to improve ability to use RUE during dressing tasks with minimal compensatory techniques.   Goal status: IN PROGRESS  3.  Pt will increase RUE strength to 3+/5 to improve ability to reach for items at waist to chest height during bathing and grooming tasks.   Goal status: IN PROGRESS  LONG TERM GOALS: Target date: 05/10/24  Pt will decrease pain in RUE to 3/10 or less to improve ability to sleep for 2+ consecutive hours without waking due to pain.   Goal status: IN PROGRESS  2.  Pt will decrease RUE fascial restrictions to min amounts or less to improve mobility required for functional reaching tasks.   Goal status: IN PROGRESS  3.  Pt will increase RUE A/ROM by 35 degrees to improve ability to use RUE when reaching overhead or behind back during dressing and  bathing tasks.   Goal status: IN PROGRESS  4.  Pt will increase RUE strength to 5/5 or greater to improve ability to use RUE when lifting or carrying items during meal preparation/housework/yardwork tasks.   Goal status: IN PROGRESS  5.  Pt will return to highest level of function using RUE as dominant during functional task completion.   Goal status: IN PROGRESS   ASSESSMENT:  CLINICAL IMPRESSION: Pt is achieving full ROM with improving activity tolerance. Session focused on improving strength with OT adding multiple resistance band exercises for strengthening. Verbal and tactile cuing provided for positioning and technique.  PERFORMANCE DEFICITS: in functional skills including in functional skills including ADLs, IADLs, coordination, tone, ROM, strength, pain, fascial restrictions, muscle spasms, and UE functional use.   PLAN:  OT FREQUENCY: 2x/week  OT DURATION: 6 weeks  PLANNED INTERVENTIONS: 02831 OT Re-evaluation, [phone]  self care/ADL training, 02889 therapeutic exercise, 97530 therapeutic activity, 97112 neuromuscular re-education, 97140 manual therapy, 97035 ultrasound, 97010 moist heat, 97032 electrical stimulation (manual), passive range of motion, functional mobility training, energy conservation, coping strategies training, patient/family education, and DME and/or AE instructions  RECOMMENDED OTHER SERVICES: N/A  CONSULTED AND AGREED WITH PLAN OF CARE: Patient  PLAN FOR NEXT SESSION: Manual Therapy, P/ROM, Thumb Tacs, [address]  [place], OTR/L [facility] [phone] Valentin Jillyn Nightingale, OT 04/25/2024, 2:32 PM

## 2024-04-25 NOTE — Patient Instructions (Signed)
Complete _______ repetitions each. Complete _______ time a day.  Wall taps with theraband  With a looped elastic band around forearms/wrists and arms at a 90 angle pressed against the wall. Keeping elbows on the wall, tap right arm out to the right. Hold for 1 second. Return right arm back to neutral. Repeat with left arm.    Wall V slides with Theraband  Place a band loop around hands/forearms and face a wall. Extend both arms diagonally into a V shape on the wall. Hold this stretch for specified amount of time. Lower arms slowly back into neutral position. Repeat.       scap clocks  Tie a loop with a theraband and place around your wrists.  Stand with a wall in front of you.  Picture a clock in front of you.  Place both palms on the wall, arms straight.  While keeping the left/right hand planted, use the right/left hand to pull away and tap each number (1, 3, 5- right OR 11,9,7 left), coming back to center each time.      Theraband strengthening: Complete 10-15X, 1-2X/day  1) Shoulder protraction  Anchor band in doorway, stand with back to door. Push your hand forward as much as you can to bringing your shoulder blades forward on your rib cage.      2) Shoulder horizontal abduction  Standing with a theraband anchored at chest height, begin with arm straight and some tension in the band. Move your arm out to your side (keeping straight the whole time). Bring the affected arm back to midline.     3) Shoulder Internal Rotation  While holding an elastic band at your side with your elbow bent, start with your hand away from your stomach, then pull the band towards your stomach. Keep your elbow near your side the entire time.     4) Shoulder External Rotation  While holding an elastic band at your side with your elbow bent, start with your hand near your stomach and then pull the band away. Keep your elbow at your side the entire time.     5) Shoulder flexion  While  standing with back to the door, holding Theraband at hand level, raise arm in front of you.  Keep elbow straight through entire movement.      6) Shoulder abduction  While holding an elastic band at your side, draw up your arm to the side keeping your elbow straight.   

## 2024-04-26 ENCOUNTER — Ambulatory Visit (HOSPITAL_COMMUNITY): Admitting: Occupational Therapy

## 2024-04-26 ENCOUNTER — Encounter (HOSPITAL_COMMUNITY): Payer: Self-pay | Admitting: Occupational Therapy

## 2024-04-26 ENCOUNTER — Encounter (HOSPITAL_COMMUNITY): Admitting: Occupational Therapy

## 2024-04-26 DIAGNOSIS — R29898 Other symptoms and signs involving the musculoskeletal system: Secondary | ICD-10-CM

## 2024-04-26 DIAGNOSIS — M25611 Stiffness of right shoulder, not elsewhere classified: Secondary | ICD-10-CM | POA: Diagnosis not present

## 2024-04-26 DIAGNOSIS — M25511 Pain in right shoulder: Secondary | ICD-10-CM

## 2024-04-26 NOTE — Therapy (Signed)
 OUTPATIENT OCCUPATIONAL THERAPY ORTHO TREATMENT NOTE  Patient Name: Hannah Ward MRN: [id number] DOB:05/05/1971, 53 y.o., female Today's Date: 04/26/2024  OCCUPATIONAL THERAPY DISCHARGE SUMMARY  Visits from Start of Care: 10  Current functional level related to goals / functional outcomes: Pt has met all OT goals.    Remaining deficits: None.   Education / Equipment: Pt was provided comprehensive HEP..    Plan: Patient agrees to discharge as all OT goals have been met.      END OF SESSION:  OT End of Session - 04/26/24 1435     Visit Number 10    Number of Visits 13    Date for OT Re-Evaluation 05/10/24    Authorization Type [employer]    OT Start Time 1350    OT Stop Time 1424    OT Time Calculation (min) 34 min    Activity Tolerance Patient tolerated treatment well    Behavior During Therapy WFL for tasks assessed/performed            Past Medical History:  Diagnosis Date   Asthma    Cervical cancer (HCC)    Melanoma (HCC)    Thyroid  disease    Past Surgical History:  Procedure Laterality Date   mole removal     There are no active problems to display for this patient.   PCP: Johnetta Savant, PA-C REFERRING PROVIDER: Johnson Reusing, PA-C  ONSET DATE: 02/29/24  REFERRING DIAG: D57.708J (ICD-10-CM) - Other displaced fracture of upper end of right humerus, initial encounter for closed fracture   THERAPY DIAG:  Acute pain of right shoulder  Shoulder stiffness, right  Other symptoms and signs involving the musculoskeletal system  Rationale for Evaluation and Treatment: Rehabilitation  SUBJECTIVE:   SUBJECTIVE STATEMENT: I just did 2 hair cuts this morning and feel great from it. Pt accompanied by: self  PERTINENT HISTORY: Pt fell at work and sustained a minimally displaced fracture. No surgery needed at this time.   PRECAUTIONS: Shoulder  WEIGHT BEARING RESTRICTIONS: Yes >1#  PAIN:  Are you having pain? No  FALLS: Has patient  fallen in last 6 months? Yes. Number of falls 1  PLOF: Independent  PATIENT GOALS: To reduce pain and improve mobility  NEXT MD VISIT: 04/16/24  OBJECTIVE:   HAND DOMINANCE: Right  ADLs: Overall ADLs: pt unable to reach up or back with RUE, limiting dressing, bathing, grooming, as well as IADL's such as dishes, cooking, cleaning, and other house tasks. Pt reports every 30-45 mins due to pain when trying to sleep.   FUNCTIONAL OUTCOME MEASURES: Quick Dash: 72.73  UPPER EXTREMITY ROM:       Assessed in supine, er/IR adducted  Passive ROM Right eval Right 04/26/24  Shoulder flexion 113 142  Shoulder abduction 90 161  Shoulder internal rotation 90 90  Shoulder external rotation 30 88  (Blank rows = not tested)    UPPER EXTREMITY MMT:     Assessed in seated, er/IR adducted  MMT Right eval Right 04/26/24  Shoulder flexion  5/5  Shoulder abduction  4+/5  Shoulder internal rotation  5/5  Shoulder external rotation  4+/5  (Blank rows = not tested)  SENSATION: WFL  EDEMA: Mild swelling noted around biceps to triceps region  OBSERVATIONS: Bruising noted on entirety of bicep and small area along superior region of triceps.    TODAY'S TREATMENT:  DATE:   04/26/24 -Manual Therapy: myofascial release and trigger point applied to biceps, trapezius, scapular region, and axillary region in order to reduce fascial restrictions and improve ROM. -A/ROM: supine, protraction, flexion, horizontal abduction, er/IR, abduction, x15 -X to V arms, x15 -Goal Post arms, x15 -Proximal Shoulder Exercise: paddles, criss cross, circles both directions, x10 -Reassessment -Reviewed HEP and provided Blue resistance band.  04/25/24 -Manual Therapy: myofascial release and trigger point applied to biceps, trapezius, scapular region, and axillary region in order to reduce  fascial restrictions and improve ROM. -A/ROM: supine, protraction, flexion, horizontal abduction, er/IR, abduction, x15 -X to V arms, x15 -Goal Post arms, x15 -Loop Band Exercises: green band, wall taps, V ups, wall clocks, x15 -Shoulder Strengthening: red band, flexion, abduction, green band, er, IR, horizontal abduction, x15  04/19/24 -Manual Therapy: myofascial release and trigger point applied to biceps, trapezius, scapular region, and axillary region in order to reduce fascial restrictions and improve ROM. -A/ROM: supine, protraction, flexion, horizontal abduction, er/IR, abduction, x15 -X to V arms, x15 -Goal Post arms, x15 -ABC's on the wall, green ball -Overhead Lacing  04/17/24 -Manual Therapy: myofascial release and trigger point applied to biceps, trapezius, scapular region, and axillary region in order to reduce fascial restrictions and improve ROM. -A/ROM: supine, protraction, flexion, horizontal abduction, er/IR, abduction, x15 -X to V arms, x15 -Goal Post arms, x15 -ABC's on the wall, green ball -Stretching: flexion, er on the door, corner stretch, er behind the back, 4x15 -UBE Level 4, 2' forwards and backwards   PATIENT EDUCATION: Education details: Loop Band Exercises and Shoulder Strengthening Person educated: Patient Education method: Explanation, Demonstration, and Handouts Education comprehension: verbalized understanding and returned demonstration  HOME EXERCISE PROGRAM: 8/4: Table Slides and Pendulums 8/8: AA/ROM 8/13: A/ROM and Scapular Strengthening 8/20: PNF Strengthening 8/27: stretches 9/4: Loop Band Exercises and Shoulder Strengthening  GOALS: Goals reviewed with patient? Yes   SHORT TERM GOALS: Target date: 04/10/24  Pt will be provided with and educated on HEP to improve mobility in RUE required for use during ADL completion.   Goal status: IN PROGRESS  2.  Pt will increase RUE P/ROM by 30 degrees to improve ability to use RUE during  dressing tasks with minimal compensatory techniques.   Goal status: MET  3.  Pt will increase RUE strength to 3+/5 to improve ability to reach for items at waist to chest height during bathing and grooming tasks.   Goal status: MET  LONG TERM GOALS: Target date: 05/10/24  Pt will decrease pain in RUE to 3/10 or less to improve ability to sleep for 2+ consecutive hours without waking due to pain.   Goal status: MET  2.  Pt will decrease RUE fascial restrictions to min amounts or less to improve mobility required for functional reaching tasks.   Goal status: MET  3.  Pt will increase RUE A/ROM by 35 degrees to improve ability to use RUE when reaching overhead or behind back during dressing and bathing tasks.   Goal status: MET  4.  Pt will increase RUE strength to 5/5 or greater to improve ability to use RUE when lifting or carrying items during meal preparation/housework/yardwork tasks.   Goal status: MET  5.  Pt will return to highest level of function using RUE as dominant during functional task completion.   Goal status: MET   ASSESSMENT:  CLINICAL IMPRESSION: This session pt completed reassessment for discharge. She is demonstrating good return of functional mobility and strength. Pt has no pain  at this time and limited fascial restrictions, allowing for full ROM and WFL strength. Pt has been provided a comprehensive HEP to continue working on improving strength and endurance. She has no further skilled OT needs and will be discharged from OP OT.  PERFORMANCE DEFICITS: in functional skills including in functional skills including ADLs, IADLs, coordination, tone, ROM, strength, pain, fascial restrictions, muscle spasms, and UE functional use.   PLAN:  OT FREQUENCY: 2x/week  OT DURATION: 6 weeks  PLANNED INTERVENTIONS: 97168 OT Re-evaluation, 97535 self care/ADL training, 02889 therapeutic exercise, 97530 therapeutic activity, 97112 neuromuscular re-education, 97140  manual therapy, 97035 ultrasound, 97010 moist heat, 97032 electrical stimulation (manual), passive range of motion, functional mobility training, energy conservation, coping strategies training, patient/family education, and DME and/or AE instructions  RECOMMENDED OTHER SERVICES: N/A  CONSULTED AND AGREED WITH PLAN OF CARE: Patient  PLAN FOR NEXT SESSION: Discharge  Marjon Doxtater Thelbert, OTR/L [facility] [phone] Evolet Salminen Jillyn Thelbert, OT 04/26/2024, 2:36 PM

## 2024-04-29 DIAGNOSIS — Z79899 Other long term (current) drug therapy: Secondary | ICD-10-CM | POA: Diagnosis not present

## 2024-04-30 ENCOUNTER — Ambulatory Visit (HOSPITAL_COMMUNITY): Admitting: Occupational Therapy

## 2024-05-01 DIAGNOSIS — R748 Abnormal levels of other serum enzymes: Secondary | ICD-10-CM | POA: Diagnosis not present

## 2024-05-02 ENCOUNTER — Encounter (HOSPITAL_COMMUNITY): Admitting: Occupational Therapy

## 2024-05-03 DIAGNOSIS — Z419 Encounter for procedure for purposes other than remedying health state, unspecified: Secondary | ICD-10-CM | POA: Diagnosis not present

## 2024-05-07 ENCOUNTER — Encounter (HOSPITAL_COMMUNITY): Admitting: Occupational Therapy

## 2024-05-13 DIAGNOSIS — M25472 Effusion, left ankle: Secondary | ICD-10-CM | POA: Diagnosis not present

## 2024-05-13 DIAGNOSIS — M25475 Effusion, left foot: Secondary | ICD-10-CM | POA: Diagnosis not present

## 2024-05-13 DIAGNOSIS — M25474 Effusion, right foot: Secondary | ICD-10-CM | POA: Diagnosis not present

## 2024-05-13 DIAGNOSIS — Z85828 Personal history of other malignant neoplasm of skin: Secondary | ICD-10-CM | POA: Diagnosis not present

## 2024-05-13 DIAGNOSIS — M25471 Effusion, right ankle: Secondary | ICD-10-CM | POA: Diagnosis not present

## 2024-05-14 DIAGNOSIS — C4359 Malignant melanoma of other part of trunk: Secondary | ICD-10-CM | POA: Diagnosis not present

## 2024-05-14 DIAGNOSIS — Z79899 Other long term (current) drug therapy: Secondary | ICD-10-CM | POA: Diagnosis not present

## 2024-05-19 DIAGNOSIS — S52125A Nondisplaced fracture of head of left radius, initial encounter for closed fracture: Secondary | ICD-10-CM | POA: Diagnosis not present

## 2024-05-19 DIAGNOSIS — S52124A Nondisplaced fracture of head of right radius, initial encounter for closed fracture: Secondary | ICD-10-CM | POA: Diagnosis not present

## 2024-05-20 DIAGNOSIS — F4323 Adjustment disorder with mixed anxiety and depressed mood: Secondary | ICD-10-CM | POA: Diagnosis not present

## 2024-05-24 DIAGNOSIS — S52132A Displaced fracture of neck of left radius, initial encounter for closed fracture: Secondary | ICD-10-CM | POA: Diagnosis not present

## 2024-05-24 DIAGNOSIS — S52131A Displaced fracture of neck of right radius, initial encounter for closed fracture: Secondary | ICD-10-CM | POA: Diagnosis not present

## 2024-05-24 DIAGNOSIS — M25522 Pain in left elbow: Secondary | ICD-10-CM | POA: Diagnosis not present

## 2024-05-24 DIAGNOSIS — M25521 Pain in right elbow: Secondary | ICD-10-CM | POA: Diagnosis not present

## 2024-05-28 DIAGNOSIS — T148XXA Other injury of unspecified body region, initial encounter: Secondary | ICD-10-CM | POA: Diagnosis not present

## 2024-05-28 DIAGNOSIS — C44311 Basal cell carcinoma of skin of nose: Secondary | ICD-10-CM | POA: Diagnosis not present

## 2024-05-28 DIAGNOSIS — L814 Other melanin hyperpigmentation: Secondary | ICD-10-CM | POA: Diagnosis not present

## 2024-05-28 DIAGNOSIS — D489 Neoplasm of uncertain behavior, unspecified: Secondary | ICD-10-CM | POA: Diagnosis not present

## 2024-05-28 DIAGNOSIS — Z85820 Personal history of malignant melanoma of skin: Secondary | ICD-10-CM | POA: Diagnosis not present

## 2024-05-28 DIAGNOSIS — D229 Melanocytic nevi, unspecified: Secondary | ICD-10-CM | POA: Diagnosis not present

## 2024-05-28 DIAGNOSIS — L821 Other seborrheic keratosis: Secondary | ICD-10-CM | POA: Diagnosis not present

## 2024-05-31 DIAGNOSIS — Z79899 Other long term (current) drug therapy: Secondary | ICD-10-CM | POA: Diagnosis not present

## 2024-06-25 DIAGNOSIS — S52132A Displaced fracture of neck of left radius, initial encounter for closed fracture: Secondary | ICD-10-CM | POA: Diagnosis not present

## 2024-06-25 DIAGNOSIS — S52131A Displaced fracture of neck of right radius, initial encounter for closed fracture: Secondary | ICD-10-CM | POA: Diagnosis not present

## 2024-07-03 DIAGNOSIS — R748 Abnormal levels of other serum enzymes: Secondary | ICD-10-CM | POA: Diagnosis not present

## 2024-07-03 DIAGNOSIS — R609 Edema, unspecified: Secondary | ICD-10-CM | POA: Diagnosis not present

## 2024-07-23 DIAGNOSIS — K760 Fatty (change of) liver, not elsewhere classified: Secondary | ICD-10-CM | POA: Diagnosis not present

## 2024-07-23 DIAGNOSIS — C4359 Malignant melanoma of other part of trunk: Secondary | ICD-10-CM | POA: Diagnosis not present

## 2024-07-23 DIAGNOSIS — R600 Localized edema: Secondary | ICD-10-CM | POA: Diagnosis not present

## 2024-07-23 DIAGNOSIS — M8580 Other specified disorders of bone density and structure, unspecified site: Secondary | ICD-10-CM | POA: Diagnosis not present

## 2024-07-23 DIAGNOSIS — N838 Other noninflammatory disorders of ovary, fallopian tube and broad ligament: Secondary | ICD-10-CM | POA: Diagnosis not present

## 2024-07-23 DIAGNOSIS — M5136 Other intervertebral disc degeneration, lumbar region with discogenic back pain only: Secondary | ICD-10-CM | POA: Diagnosis not present

## 2024-07-23 DIAGNOSIS — Z780 Asymptomatic menopausal state: Secondary | ICD-10-CM | POA: Diagnosis not present

## 2024-07-23 DIAGNOSIS — M8589 Other specified disorders of bone density and structure, multiple sites: Secondary | ICD-10-CM | POA: Diagnosis not present

## 2024-07-23 DIAGNOSIS — J439 Emphysema, unspecified: Secondary | ICD-10-CM | POA: Diagnosis not present

## 2024-07-23 DIAGNOSIS — J45909 Unspecified asthma, uncomplicated: Secondary | ICD-10-CM | POA: Diagnosis not present

## 2024-07-23 DIAGNOSIS — C774 Secondary and unspecified malignant neoplasm of inguinal and lower limb lymph nodes: Secondary | ICD-10-CM | POA: Diagnosis not present

## 2024-07-23 DIAGNOSIS — Z79899 Other long term (current) drug therapy: Secondary | ICD-10-CM | POA: Diagnosis not present

## 2024-07-23 DIAGNOSIS — M810 Age-related osteoporosis without current pathological fracture: Secondary | ICD-10-CM | POA: Diagnosis not present

## 2024-07-23 DIAGNOSIS — I10 Essential (primary) hypertension: Secondary | ICD-10-CM | POA: Diagnosis not present

## 2024-07-23 DIAGNOSIS — G8929 Other chronic pain: Secondary | ICD-10-CM | POA: Diagnosis not present

## 2024-07-23 DIAGNOSIS — R7401 Elevation of levels of liver transaminase levels: Secondary | ICD-10-CM | POA: Diagnosis not present

## 2024-07-30 DIAGNOSIS — C4401 Basal cell carcinoma of skin of lip: Secondary | ICD-10-CM | POA: Diagnosis not present

## 2024-08-02 DIAGNOSIS — Z419 Encounter for procedure for purposes other than remedying health state, unspecified: Secondary | ICD-10-CM | POA: Diagnosis not present

## 2024-08-06 DIAGNOSIS — Z85820 Personal history of malignant melanoma of skin: Secondary | ICD-10-CM | POA: Diagnosis not present

## 2024-08-06 DIAGNOSIS — M8000XD Age-related osteoporosis with current pathological fracture, unspecified site, subsequent encounter for fracture with routine healing: Secondary | ICD-10-CM | POA: Diagnosis not present

## 2024-08-06 DIAGNOSIS — C4359 Malignant melanoma of other part of trunk: Secondary | ICD-10-CM | POA: Diagnosis not present

## 2024-08-06 DIAGNOSIS — T45AX5A Adverse effect of immune checkpoint inhibitors and immunostimulant drugs, initial encounter: Secondary | ICD-10-CM | POA: Diagnosis not present

## 2024-08-06 DIAGNOSIS — R222 Localized swelling, mass and lump, trunk: Secondary | ICD-10-CM | POA: Diagnosis not present

## 2024-08-06 DIAGNOSIS — E032 Hypothyroidism due to medicaments and other exogenous substances: Secondary | ICD-10-CM | POA: Diagnosis not present

## 2024-08-13 DIAGNOSIS — C439 Malignant melanoma of skin, unspecified: Secondary | ICD-10-CM | POA: Diagnosis not present

## 2024-08-13 DIAGNOSIS — C4359 Malignant melanoma of other part of trunk: Secondary | ICD-10-CM | POA: Diagnosis not present

## 2024-08-13 DIAGNOSIS — Z79899 Other long term (current) drug therapy: Secondary | ICD-10-CM | POA: Diagnosis not present

## 2024-08-13 DIAGNOSIS — C4361 Malignant melanoma of right upper limb, including shoulder: Secondary | ICD-10-CM | POA: Diagnosis not present

## 2024-08-20 DIAGNOSIS — S52132D Displaced fracture of neck of left radius, subsequent encounter for closed fracture with routine healing: Secondary | ICD-10-CM | POA: Diagnosis not present
